# Patient Record
Sex: Female | Born: 1940
Health system: Southern US, Community
[De-identification: ages and names within clinical notes are randomized; demographics above are authoritative.]

## PROBLEM LIST (undated history)

## (undated) DIAGNOSIS — I480 Paroxysmal atrial fibrillation: Secondary | ICD-10-CM

## (undated) DIAGNOSIS — F0390 Unspecified dementia without behavioral disturbance: Secondary | ICD-10-CM

## (undated) DIAGNOSIS — I10 Essential (primary) hypertension: Secondary | ICD-10-CM

## (undated) DIAGNOSIS — M1990 Unspecified osteoarthritis, unspecified site: Secondary | ICD-10-CM

## (undated) HISTORY — PX: ABDOMINAL HYSTERECTOMY: SHX81

---

## 2005-06-14 ENCOUNTER — Ambulatory Visit: Payer: Self-pay | Admitting: Internal Medicine

## 2005-06-22 ENCOUNTER — Ambulatory Visit: Payer: Self-pay | Admitting: Internal Medicine

## 2006-05-22 ENCOUNTER — Ambulatory Visit: Payer: Self-pay | Admitting: Internal Medicine

## 2006-06-04 ENCOUNTER — Ambulatory Visit: Payer: Self-pay | Admitting: Internal Medicine

## 2006-10-01 ENCOUNTER — Ambulatory Visit: Payer: Self-pay | Admitting: Gastroenterology

## 2006-10-15 ENCOUNTER — Ambulatory Visit: Payer: Self-pay | Admitting: Gastroenterology

## 2007-06-06 ENCOUNTER — Ambulatory Visit: Payer: Self-pay | Admitting: Internal Medicine

## 2008-03-14 ENCOUNTER — Emergency Department: Payer: Self-pay | Admitting: Emergency Medicine

## 2008-06-08 ENCOUNTER — Ambulatory Visit: Payer: Self-pay | Admitting: Internal Medicine

## 2008-12-11 ENCOUNTER — Observation Stay: Payer: Self-pay | Admitting: Internal Medicine

## 2009-07-02 ENCOUNTER — Ambulatory Visit: Payer: Self-pay | Admitting: Internal Medicine

## 2010-07-04 ENCOUNTER — Ambulatory Visit: Payer: Self-pay | Admitting: Internal Medicine

## 2011-07-06 ENCOUNTER — Ambulatory Visit: Payer: Self-pay | Admitting: Internal Medicine

## 2012-05-03 ENCOUNTER — Ambulatory Visit: Payer: Self-pay | Admitting: Gastroenterology

## 2012-07-09 ENCOUNTER — Ambulatory Visit: Payer: Self-pay | Admitting: Internal Medicine

## 2012-12-12 ENCOUNTER — Other Ambulatory Visit: Payer: Self-pay | Admitting: Gastroenterology

## 2012-12-12 DIAGNOSIS — Z1211 Encounter for screening for malignant neoplasm of colon: Secondary | ICD-10-CM

## 2012-12-12 DIAGNOSIS — K5730 Diverticulosis of large intestine without perforation or abscess without bleeding: Secondary | ICD-10-CM

## 2012-12-23 ENCOUNTER — Ambulatory Visit
Admission: RE | Admit: 2012-12-23 | Discharge: 2012-12-23 | Disposition: A | Discharge status: Home / Self Care | Payer: Medicare Other | Source: Ambulatory Visit | Attending: Gastroenterology | Admitting: Gastroenterology

## 2012-12-23 DIAGNOSIS — K5730 Diverticulosis of large intestine without perforation or abscess without bleeding: Secondary | ICD-10-CM

## 2012-12-23 DIAGNOSIS — Z1211 Encounter for screening for malignant neoplasm of colon: Secondary | ICD-10-CM

## 2013-07-10 ENCOUNTER — Ambulatory Visit: Payer: Self-pay | Admitting: Family Medicine

## 2014-02-13 DIAGNOSIS — E7800 Pure hypercholesterolemia, unspecified: Secondary | ICD-10-CM | POA: Insufficient documentation

## 2014-02-13 DIAGNOSIS — I10 Essential (primary) hypertension: Secondary | ICD-10-CM | POA: Diagnosis present

## 2014-07-14 ENCOUNTER — Ambulatory Visit: Payer: Self-pay | Admitting: Family Medicine

## 2014-07-23 ENCOUNTER — Emergency Department: Payer: Self-pay | Admitting: Internal Medicine

## 2014-07-23 LAB — URINALYSIS, COMPLETE
BACTERIA: NONE SEEN
BILIRUBIN, UR: NEGATIVE
BLOOD: NEGATIVE
GLUCOSE, UR: NEGATIVE mg/dL (ref 0–75)
Hyaline Cast: 2
KETONE: NEGATIVE
Nitrite: NEGATIVE
PH: 6 (ref 4.5–8.0)
PROTEIN: NEGATIVE
RBC,UR: 1 /HPF (ref 0–5)
SPECIFIC GRAVITY: 1.023 (ref 1.003–1.030)
Squamous Epithelial: 1
WBC UR: <1 /HPF (ref 0–5)

## 2014-07-23 LAB — TROPONIN I

## 2014-07-23 LAB — COMPREHENSIVE METABOLIC PANEL
ALT: 25 U/L
AST: 15 U/L (ref 15–37)
Albumin: 3.6 g/dL (ref 3.4–5.0)
Alkaline Phosphatase: 40 U/L — ABNORMAL LOW
Anion Gap: 6 — ABNORMAL LOW (ref 7–16)
BILIRUBIN TOTAL: 0.3 mg/dL (ref 0.2–1.0)
BUN: 18 mg/dL (ref 7–18)
CO2: 30 mmol/L (ref 21–32)
CREATININE: 0.77 mg/dL (ref 0.60–1.30)
Calcium, Total: 8.9 mg/dL (ref 8.5–10.1)
Chloride: 105 mmol/L (ref 98–107)
Glucose: 83 mg/dL (ref 65–99)
OSMOLALITY: 282 (ref 275–301)
Potassium: 3.7 mmol/L (ref 3.5–5.1)
SODIUM: 141 mmol/L (ref 136–145)
Total Protein: 7.3 g/dL (ref 6.4–8.2)

## 2014-07-23 LAB — CBC
HCT: 34.1 % — ABNORMAL LOW (ref 35.0–47.0)
HGB: 11 g/dL — ABNORMAL LOW (ref 12.0–16.0)
MCH: 26.3 pg (ref 26.0–34.0)
MCHC: 32.3 g/dL (ref 32.0–36.0)
MCV: 81 fL (ref 80–100)
Platelet: 313 10*3/uL (ref 150–440)
RBC: 4.2 10*6/uL (ref 3.80–5.20)
RDW: 14.2 % (ref 11.5–14.5)
WBC: 5.5 10*3/uL (ref 3.6–11.0)

## 2014-11-18 DIAGNOSIS — D649 Anemia, unspecified: Secondary | ICD-10-CM | POA: Diagnosis not present

## 2014-11-18 DIAGNOSIS — E785 Hyperlipidemia, unspecified: Secondary | ICD-10-CM | POA: Diagnosis not present

## 2014-11-18 DIAGNOSIS — Z79899 Other long term (current) drug therapy: Secondary | ICD-10-CM | POA: Diagnosis not present

## 2014-11-19 DIAGNOSIS — M25561 Pain in right knee: Secondary | ICD-10-CM | POA: Diagnosis not present

## 2014-11-19 DIAGNOSIS — G8929 Other chronic pain: Secondary | ICD-10-CM | POA: Diagnosis not present

## 2014-11-26 DIAGNOSIS — D508 Other iron deficiency anemias: Secondary | ICD-10-CM | POA: Diagnosis not present

## 2014-11-26 DIAGNOSIS — I10 Essential (primary) hypertension: Secondary | ICD-10-CM | POA: Diagnosis not present

## 2014-11-26 DIAGNOSIS — E780 Pure hypercholesterolemia: Secondary | ICD-10-CM | POA: Diagnosis not present

## 2014-11-26 DIAGNOSIS — M1711 Unilateral primary osteoarthritis, right knee: Secondary | ICD-10-CM | POA: Diagnosis not present

## 2014-12-29 DIAGNOSIS — M1711 Unilateral primary osteoarthritis, right knee: Secondary | ICD-10-CM | POA: Diagnosis not present

## 2014-12-31 ENCOUNTER — Other Ambulatory Visit: Payer: Self-pay | Admitting: Unknown Physician Specialty

## 2014-12-31 DIAGNOSIS — M1711 Unilateral primary osteoarthritis, right knee: Secondary | ICD-10-CM

## 2014-12-31 DIAGNOSIS — M25561 Pain in right knee: Secondary | ICD-10-CM

## 2015-01-13 ENCOUNTER — Ambulatory Visit
Admission: RE | Admit: 2015-01-13 | Discharge: 2015-01-13 | Disposition: A | Discharge status: Home / Self Care | Payer: Commercial Managed Care - HMO | Source: Ambulatory Visit | Attending: Unknown Physician Specialty | Admitting: Unknown Physician Specialty

## 2015-01-13 DIAGNOSIS — S83241A Other tear of medial meniscus, current injury, right knee, initial encounter: Secondary | ICD-10-CM | POA: Insufficient documentation

## 2015-01-13 DIAGNOSIS — M25461 Effusion, right knee: Secondary | ICD-10-CM | POA: Diagnosis not present

## 2015-01-13 DIAGNOSIS — M7121 Synovial cyst of popliteal space [Baker], right knee: Secondary | ICD-10-CM | POA: Diagnosis not present

## 2015-01-13 DIAGNOSIS — M25561 Pain in right knee: Secondary | ICD-10-CM | POA: Diagnosis present

## 2015-01-13 DIAGNOSIS — X58XXXA Exposure to other specified factors, initial encounter: Secondary | ICD-10-CM | POA: Insufficient documentation

## 2015-01-13 DIAGNOSIS — M23231 Derangement of other medial meniscus due to old tear or injury, right knee: Secondary | ICD-10-CM | POA: Diagnosis not present

## 2015-04-02 DIAGNOSIS — R634 Abnormal weight loss: Secondary | ICD-10-CM | POA: Diagnosis not present

## 2015-04-02 DIAGNOSIS — Z79899 Other long term (current) drug therapy: Secondary | ICD-10-CM | POA: Diagnosis not present

## 2015-04-02 DIAGNOSIS — E780 Pure hypercholesterolemia: Secondary | ICD-10-CM | POA: Diagnosis not present

## 2015-04-02 DIAGNOSIS — R0602 Shortness of breath: Secondary | ICD-10-CM | POA: Diagnosis not present

## 2015-04-02 DIAGNOSIS — D508 Other iron deficiency anemias: Secondary | ICD-10-CM | POA: Diagnosis not present

## 2015-04-02 DIAGNOSIS — R61 Generalized hyperhidrosis: Secondary | ICD-10-CM | POA: Diagnosis not present

## 2015-04-05 DIAGNOSIS — R0602 Shortness of breath: Secondary | ICD-10-CM | POA: Diagnosis not present

## 2015-05-24 DIAGNOSIS — E780 Pure hypercholesterolemia: Secondary | ICD-10-CM | POA: Diagnosis not present

## 2015-05-24 DIAGNOSIS — M1711 Unilateral primary osteoarthritis, right knee: Secondary | ICD-10-CM | POA: Diagnosis not present

## 2015-05-24 DIAGNOSIS — Z79899 Other long term (current) drug therapy: Secondary | ICD-10-CM | POA: Diagnosis not present

## 2015-05-24 DIAGNOSIS — I10 Essential (primary) hypertension: Secondary | ICD-10-CM | POA: Diagnosis not present

## 2015-09-10 DIAGNOSIS — H2513 Age-related nuclear cataract, bilateral: Secondary | ICD-10-CM | POA: Diagnosis not present

## 2015-11-15 DIAGNOSIS — D649 Anemia, unspecified: Secondary | ICD-10-CM | POA: Diagnosis not present

## 2015-11-15 DIAGNOSIS — Z79899 Other long term (current) drug therapy: Secondary | ICD-10-CM | POA: Diagnosis not present

## 2015-11-15 DIAGNOSIS — E7800 Pure hypercholesterolemia, unspecified: Secondary | ICD-10-CM | POA: Diagnosis not present

## 2015-11-23 ENCOUNTER — Other Ambulatory Visit: Payer: Self-pay | Admitting: Family Medicine

## 2015-11-23 DIAGNOSIS — Z0000 Encounter for general adult medical examination without abnormal findings: Secondary | ICD-10-CM | POA: Diagnosis not present

## 2015-11-23 DIAGNOSIS — Z1231 Encounter for screening mammogram for malignant neoplasm of breast: Secondary | ICD-10-CM

## 2015-11-23 DIAGNOSIS — Z79899 Other long term (current) drug therapy: Secondary | ICD-10-CM | POA: Diagnosis not present

## 2015-11-23 DIAGNOSIS — E7800 Pure hypercholesterolemia, unspecified: Secondary | ICD-10-CM | POA: Diagnosis not present

## 2015-11-23 DIAGNOSIS — I10 Essential (primary) hypertension: Secondary | ICD-10-CM | POA: Diagnosis not present

## 2015-12-09 ENCOUNTER — Other Ambulatory Visit: Payer: Self-pay | Admitting: Family Medicine

## 2015-12-09 ENCOUNTER — Ambulatory Visit
Admission: RE | Admit: 2015-12-09 | Discharge: 2015-12-09 | Disposition: A | Discharge status: Home / Self Care | Payer: Commercial Managed Care - HMO | Source: Ambulatory Visit | Attending: Family Medicine | Admitting: Family Medicine

## 2015-12-09 DIAGNOSIS — Z1231 Encounter for screening mammogram for malignant neoplasm of breast: Secondary | ICD-10-CM

## 2015-12-30 DIAGNOSIS — R51 Headache: Secondary | ICD-10-CM | POA: Diagnosis not present

## 2016-02-01 DIAGNOSIS — L720 Epidermal cyst: Secondary | ICD-10-CM | POA: Diagnosis not present

## 2016-05-18 DIAGNOSIS — E7800 Pure hypercholesterolemia, unspecified: Secondary | ICD-10-CM | POA: Diagnosis not present

## 2016-05-18 DIAGNOSIS — Z79899 Other long term (current) drug therapy: Secondary | ICD-10-CM | POA: Diagnosis not present

## 2016-05-25 DIAGNOSIS — R634 Abnormal weight loss: Secondary | ICD-10-CM | POA: Diagnosis not present

## 2016-05-25 DIAGNOSIS — E7800 Pure hypercholesterolemia, unspecified: Secondary | ICD-10-CM | POA: Diagnosis not present

## 2016-05-25 DIAGNOSIS — Z79899 Other long term (current) drug therapy: Secondary | ICD-10-CM | POA: Diagnosis not present

## 2016-05-25 DIAGNOSIS — I10 Essential (primary) hypertension: Secondary | ICD-10-CM | POA: Diagnosis not present

## 2016-05-25 DIAGNOSIS — Z23 Encounter for immunization: Secondary | ICD-10-CM | POA: Diagnosis not present

## 2016-05-29 DIAGNOSIS — M67442 Ganglion, left hand: Secondary | ICD-10-CM | POA: Diagnosis not present

## 2016-05-29 DIAGNOSIS — M79643 Pain in unspecified hand: Secondary | ICD-10-CM | POA: Insufficient documentation

## 2016-10-30 ENCOUNTER — Other Ambulatory Visit: Payer: Self-pay | Admitting: Family Medicine

## 2016-10-30 DIAGNOSIS — Z1231 Encounter for screening mammogram for malignant neoplasm of breast: Secondary | ICD-10-CM

## 2016-11-16 DIAGNOSIS — Z79899 Other long term (current) drug therapy: Secondary | ICD-10-CM | POA: Diagnosis not present

## 2016-11-16 DIAGNOSIS — E7800 Pure hypercholesterolemia, unspecified: Secondary | ICD-10-CM | POA: Diagnosis not present

## 2016-11-27 DIAGNOSIS — Z0000 Encounter for general adult medical examination without abnormal findings: Secondary | ICD-10-CM | POA: Diagnosis not present

## 2016-12-12 ENCOUNTER — Ambulatory Visit
Admission: RE | Admit: 2016-12-12 | Discharge: 2016-12-12 | Disposition: A | Discharge status: Home / Self Care | Payer: Medicare HMO | Source: Ambulatory Visit | Attending: Family Medicine | Admitting: Family Medicine

## 2016-12-12 DIAGNOSIS — Z1231 Encounter for screening mammogram for malignant neoplasm of breast: Secondary | ICD-10-CM | POA: Diagnosis not present

## 2016-12-18 DIAGNOSIS — H2513 Age-related nuclear cataract, bilateral: Secondary | ICD-10-CM | POA: Diagnosis not present

## 2017-01-19 DIAGNOSIS — H8309 Labyrinthitis, unspecified ear: Secondary | ICD-10-CM | POA: Diagnosis not present

## 2017-05-11 DIAGNOSIS — E876 Hypokalemia: Secondary | ICD-10-CM | POA: Diagnosis not present

## 2017-05-11 DIAGNOSIS — R634 Abnormal weight loss: Secondary | ICD-10-CM | POA: Diagnosis not present

## 2017-05-11 DIAGNOSIS — R5383 Other fatigue: Secondary | ICD-10-CM | POA: Diagnosis not present

## 2017-05-11 DIAGNOSIS — R630 Anorexia: Secondary | ICD-10-CM | POA: Diagnosis not present

## 2017-05-11 DIAGNOSIS — R42 Dizziness and giddiness: Secondary | ICD-10-CM | POA: Diagnosis not present

## 2017-05-11 DIAGNOSIS — R5381 Other malaise: Secondary | ICD-10-CM | POA: Diagnosis not present

## 2017-05-23 DIAGNOSIS — E7800 Pure hypercholesterolemia, unspecified: Secondary | ICD-10-CM | POA: Diagnosis not present

## 2017-05-23 DIAGNOSIS — Z79899 Other long term (current) drug therapy: Secondary | ICD-10-CM | POA: Diagnosis not present

## 2017-05-31 DIAGNOSIS — E7800 Pure hypercholesterolemia, unspecified: Secondary | ICD-10-CM | POA: Diagnosis not present

## 2017-05-31 DIAGNOSIS — E876 Hypokalemia: Secondary | ICD-10-CM | POA: Diagnosis not present

## 2017-05-31 DIAGNOSIS — Z79899 Other long term (current) drug therapy: Secondary | ICD-10-CM | POA: Diagnosis not present

## 2017-05-31 DIAGNOSIS — M25561 Pain in right knee: Secondary | ICD-10-CM | POA: Diagnosis not present

## 2017-05-31 DIAGNOSIS — I10 Essential (primary) hypertension: Secondary | ICD-10-CM | POA: Diagnosis not present

## 2017-05-31 DIAGNOSIS — G8929 Other chronic pain: Secondary | ICD-10-CM | POA: Diagnosis not present

## 2017-12-05 DIAGNOSIS — E7800 Pure hypercholesterolemia, unspecified: Secondary | ICD-10-CM | POA: Diagnosis not present

## 2017-12-05 DIAGNOSIS — Z79899 Other long term (current) drug therapy: Secondary | ICD-10-CM | POA: Diagnosis not present

## 2017-12-13 DIAGNOSIS — Z0000 Encounter for general adult medical examination without abnormal findings: Secondary | ICD-10-CM | POA: Diagnosis not present

## 2017-12-17 ENCOUNTER — Other Ambulatory Visit: Payer: Self-pay | Admitting: Family Medicine

## 2017-12-17 DIAGNOSIS — Z1231 Encounter for screening mammogram for malignant neoplasm of breast: Secondary | ICD-10-CM

## 2017-12-31 ENCOUNTER — Ambulatory Visit
Admission: RE | Admit: 2017-12-31 | Discharge: 2017-12-31 | Disposition: A | Discharge status: Home / Self Care | Payer: Medicare HMO | Source: Ambulatory Visit | Attending: Family Medicine | Admitting: Family Medicine

## 2017-12-31 DIAGNOSIS — Z1231 Encounter for screening mammogram for malignant neoplasm of breast: Secondary | ICD-10-CM | POA: Diagnosis not present

## 2018-05-08 DIAGNOSIS — H2513 Age-related nuclear cataract, bilateral: Secondary | ICD-10-CM | POA: Diagnosis not present

## 2018-06-11 DIAGNOSIS — Z79899 Other long term (current) drug therapy: Secondary | ICD-10-CM | POA: Diagnosis not present

## 2018-06-11 DIAGNOSIS — E7800 Pure hypercholesterolemia, unspecified: Secondary | ICD-10-CM | POA: Diagnosis not present

## 2018-06-14 DIAGNOSIS — E7800 Pure hypercholesterolemia, unspecified: Secondary | ICD-10-CM | POA: Diagnosis not present

## 2018-06-14 DIAGNOSIS — I10 Essential (primary) hypertension: Secondary | ICD-10-CM | POA: Diagnosis not present

## 2018-06-14 DIAGNOSIS — R634 Abnormal weight loss: Secondary | ICD-10-CM | POA: Diagnosis not present

## 2018-06-14 DIAGNOSIS — Z23 Encounter for immunization: Secondary | ICD-10-CM | POA: Diagnosis not present

## 2018-06-14 DIAGNOSIS — Z79899 Other long term (current) drug therapy: Secondary | ICD-10-CM | POA: Diagnosis not present

## 2018-08-19 DIAGNOSIS — R531 Weakness: Secondary | ICD-10-CM | POA: Diagnosis not present

## 2018-08-19 DIAGNOSIS — R630 Anorexia: Secondary | ICD-10-CM | POA: Diagnosis not present

## 2018-08-19 DIAGNOSIS — R5381 Other malaise: Secondary | ICD-10-CM | POA: Diagnosis not present

## 2018-08-19 DIAGNOSIS — R634 Abnormal weight loss: Secondary | ICD-10-CM | POA: Diagnosis not present

## 2018-08-19 DIAGNOSIS — R5383 Other fatigue: Secondary | ICD-10-CM | POA: Diagnosis not present

## 2018-12-19 DIAGNOSIS — D649 Anemia, unspecified: Secondary | ICD-10-CM | POA: Diagnosis present

## 2019-10-07 ENCOUNTER — Other Ambulatory Visit: Payer: Self-pay | Admitting: Internal Medicine

## 2019-10-07 DIAGNOSIS — R410 Disorientation, unspecified: Secondary | ICD-10-CM

## 2019-10-07 DIAGNOSIS — R413 Other amnesia: Secondary | ICD-10-CM

## 2019-10-20 ENCOUNTER — Ambulatory Visit: Payer: Medicare HMO

## 2019-10-24 ENCOUNTER — Ambulatory Visit
Admission: RE | Admit: 2019-10-24 | Discharge: 2019-10-24 | Disposition: A | Discharge status: Home / Self Care | Payer: Medicare HMO | Source: Ambulatory Visit | Attending: Internal Medicine | Admitting: Internal Medicine

## 2019-10-24 ENCOUNTER — Other Ambulatory Visit: Payer: Self-pay

## 2019-10-24 DIAGNOSIS — R413 Other amnesia: Secondary | ICD-10-CM

## 2019-10-24 DIAGNOSIS — R410 Disorientation, unspecified: Secondary | ICD-10-CM | POA: Diagnosis present

## 2019-12-03 DIAGNOSIS — F0150 Vascular dementia without behavioral disturbance: Secondary | ICD-10-CM | POA: Diagnosis present

## 2019-12-03 DIAGNOSIS — F0280 Dementia in other diseases classified elsewhere without behavioral disturbance: Secondary | ICD-10-CM | POA: Diagnosis present

## 2019-12-03 DIAGNOSIS — I6381 Other cerebral infarction due to occlusion or stenosis of small artery: Secondary | ICD-10-CM | POA: Insufficient documentation

## 2020-02-27 DIAGNOSIS — Z79899 Other long term (current) drug therapy: Secondary | ICD-10-CM | POA: Diagnosis not present

## 2020-02-27 DIAGNOSIS — E7800 Pure hypercholesterolemia, unspecified: Secondary | ICD-10-CM | POA: Diagnosis not present

## 2020-03-05 DIAGNOSIS — M1711 Unilateral primary osteoarthritis, right knee: Secondary | ICD-10-CM | POA: Diagnosis not present

## 2020-03-05 DIAGNOSIS — M25561 Pain in right knee: Secondary | ICD-10-CM | POA: Diagnosis not present

## 2020-03-05 DIAGNOSIS — G8929 Other chronic pain: Secondary | ICD-10-CM | POA: Diagnosis not present

## 2020-03-05 DIAGNOSIS — I10 Essential (primary) hypertension: Secondary | ICD-10-CM | POA: Diagnosis not present

## 2020-03-05 DIAGNOSIS — R296 Repeated falls: Secondary | ICD-10-CM | POA: Diagnosis not present

## 2020-03-18 DIAGNOSIS — F0150 Vascular dementia without behavioral disturbance: Secondary | ICD-10-CM | POA: Diagnosis not present

## 2020-03-18 DIAGNOSIS — E7800 Pure hypercholesterolemia, unspecified: Secondary | ICD-10-CM | POA: Diagnosis not present

## 2020-03-18 DIAGNOSIS — M1711 Unilateral primary osteoarthritis, right knee: Secondary | ICD-10-CM | POA: Diagnosis not present

## 2020-03-18 DIAGNOSIS — G43909 Migraine, unspecified, not intractable, without status migrainosus: Secondary | ICD-10-CM | POA: Diagnosis not present

## 2020-03-18 DIAGNOSIS — G309 Alzheimer's disease, unspecified: Secondary | ICD-10-CM | POA: Diagnosis not present

## 2020-03-18 DIAGNOSIS — F0280 Dementia in other diseases classified elsewhere without behavioral disturbance: Secondary | ICD-10-CM | POA: Diagnosis not present

## 2020-03-18 DIAGNOSIS — Z7982 Long term (current) use of aspirin: Secondary | ICD-10-CM | POA: Diagnosis not present

## 2020-03-18 DIAGNOSIS — G8929 Other chronic pain: Secondary | ICD-10-CM | POA: Diagnosis not present

## 2020-03-18 DIAGNOSIS — D649 Anemia, unspecified: Secondary | ICD-10-CM | POA: Diagnosis not present

## 2020-03-22 DIAGNOSIS — D649 Anemia, unspecified: Secondary | ICD-10-CM | POA: Diagnosis not present

## 2020-03-22 DIAGNOSIS — F0150 Vascular dementia without behavioral disturbance: Secondary | ICD-10-CM | POA: Diagnosis not present

## 2020-03-22 DIAGNOSIS — R0602 Shortness of breath: Secondary | ICD-10-CM | POA: Diagnosis not present

## 2020-03-22 DIAGNOSIS — Z79899 Other long term (current) drug therapy: Secondary | ICD-10-CM | POA: Diagnosis not present

## 2020-03-22 DIAGNOSIS — I499 Cardiac arrhythmia, unspecified: Secondary | ICD-10-CM | POA: Diagnosis not present

## 2020-03-22 DIAGNOSIS — E785 Hyperlipidemia, unspecified: Secondary | ICD-10-CM | POA: Diagnosis not present

## 2020-03-22 DIAGNOSIS — I10 Essential (primary) hypertension: Secondary | ICD-10-CM | POA: Diagnosis not present

## 2020-03-22 DIAGNOSIS — F0390 Unspecified dementia without behavioral disturbance: Secondary | ICD-10-CM | POA: Diagnosis not present

## 2020-03-22 DIAGNOSIS — Z1211 Encounter for screening for malignant neoplasm of colon: Secondary | ICD-10-CM | POA: Diagnosis not present

## 2020-03-22 DIAGNOSIS — Z0000 Encounter for general adult medical examination without abnormal findings: Secondary | ICD-10-CM | POA: Diagnosis not present

## 2020-03-22 DIAGNOSIS — F0280 Dementia in other diseases classified elsewhere without behavioral disturbance: Secondary | ICD-10-CM | POA: Diagnosis not present

## 2020-03-22 DIAGNOSIS — G309 Alzheimer's disease, unspecified: Secondary | ICD-10-CM | POA: Diagnosis not present

## 2020-03-22 DIAGNOSIS — I6381 Other cerebral infarction due to occlusion or stenosis of small artery: Secondary | ICD-10-CM | POA: Diagnosis not present

## 2020-03-26 DIAGNOSIS — I4891 Unspecified atrial fibrillation: Secondary | ICD-10-CM | POA: Diagnosis not present

## 2020-03-26 DIAGNOSIS — F0280 Dementia in other diseases classified elsewhere without behavioral disturbance: Secondary | ICD-10-CM | POA: Diagnosis not present

## 2020-03-26 DIAGNOSIS — G43909 Migraine, unspecified, not intractable, without status migrainosus: Secondary | ICD-10-CM | POA: Diagnosis not present

## 2020-03-26 DIAGNOSIS — M1711 Unilateral primary osteoarthritis, right knee: Secondary | ICD-10-CM | POA: Diagnosis not present

## 2020-03-26 DIAGNOSIS — E7800 Pure hypercholesterolemia, unspecified: Secondary | ICD-10-CM | POA: Diagnosis not present

## 2020-03-26 DIAGNOSIS — F0150 Vascular dementia without behavioral disturbance: Secondary | ICD-10-CM | POA: Diagnosis not present

## 2020-03-26 DIAGNOSIS — I10 Essential (primary) hypertension: Secondary | ICD-10-CM | POA: Diagnosis not present

## 2020-03-26 DIAGNOSIS — G309 Alzheimer's disease, unspecified: Secondary | ICD-10-CM | POA: Diagnosis not present

## 2020-03-26 DIAGNOSIS — D649 Anemia, unspecified: Secondary | ICD-10-CM | POA: Diagnosis not present

## 2020-03-29 DIAGNOSIS — F0150 Vascular dementia without behavioral disturbance: Secondary | ICD-10-CM | POA: Diagnosis not present

## 2020-03-29 DIAGNOSIS — I10 Essential (primary) hypertension: Secondary | ICD-10-CM | POA: Diagnosis not present

## 2020-03-29 DIAGNOSIS — E7800 Pure hypercholesterolemia, unspecified: Secondary | ICD-10-CM | POA: Diagnosis not present

## 2020-03-29 DIAGNOSIS — F0280 Dementia in other diseases classified elsewhere without behavioral disturbance: Secondary | ICD-10-CM | POA: Diagnosis not present

## 2020-03-29 DIAGNOSIS — G309 Alzheimer's disease, unspecified: Secondary | ICD-10-CM | POA: Diagnosis not present

## 2020-03-29 DIAGNOSIS — G43909 Migraine, unspecified, not intractable, without status migrainosus: Secondary | ICD-10-CM | POA: Diagnosis not present

## 2020-03-29 DIAGNOSIS — I4891 Unspecified atrial fibrillation: Secondary | ICD-10-CM | POA: Diagnosis not present

## 2020-03-29 DIAGNOSIS — M1711 Unilateral primary osteoarthritis, right knee: Secondary | ICD-10-CM | POA: Diagnosis not present

## 2020-03-29 DIAGNOSIS — D649 Anemia, unspecified: Secondary | ICD-10-CM | POA: Diagnosis not present

## 2020-03-31 DIAGNOSIS — I10 Essential (primary) hypertension: Secondary | ICD-10-CM | POA: Diagnosis not present

## 2020-03-31 DIAGNOSIS — M1711 Unilateral primary osteoarthritis, right knee: Secondary | ICD-10-CM | POA: Diagnosis not present

## 2020-03-31 DIAGNOSIS — F0150 Vascular dementia without behavioral disturbance: Secondary | ICD-10-CM | POA: Diagnosis not present

## 2020-03-31 DIAGNOSIS — F0280 Dementia in other diseases classified elsewhere without behavioral disturbance: Secondary | ICD-10-CM | POA: Diagnosis not present

## 2020-03-31 DIAGNOSIS — G43909 Migraine, unspecified, not intractable, without status migrainosus: Secondary | ICD-10-CM | POA: Diagnosis not present

## 2020-03-31 DIAGNOSIS — G309 Alzheimer's disease, unspecified: Secondary | ICD-10-CM | POA: Diagnosis not present

## 2020-03-31 DIAGNOSIS — I4891 Unspecified atrial fibrillation: Secondary | ICD-10-CM | POA: Diagnosis not present

## 2020-04-05 DIAGNOSIS — I4891 Unspecified atrial fibrillation: Secondary | ICD-10-CM | POA: Diagnosis not present

## 2020-04-05 DIAGNOSIS — G309 Alzheimer's disease, unspecified: Secondary | ICD-10-CM | POA: Diagnosis not present

## 2020-04-05 DIAGNOSIS — M1711 Unilateral primary osteoarthritis, right knee: Secondary | ICD-10-CM | POA: Diagnosis not present

## 2020-04-05 DIAGNOSIS — I10 Essential (primary) hypertension: Secondary | ICD-10-CM | POA: Diagnosis not present

## 2020-04-05 DIAGNOSIS — E7800 Pure hypercholesterolemia, unspecified: Secondary | ICD-10-CM | POA: Diagnosis not present

## 2020-04-05 DIAGNOSIS — F0150 Vascular dementia without behavioral disturbance: Secondary | ICD-10-CM | POA: Diagnosis not present

## 2020-04-05 DIAGNOSIS — F0280 Dementia in other diseases classified elsewhere without behavioral disturbance: Secondary | ICD-10-CM | POA: Diagnosis not present

## 2020-04-05 DIAGNOSIS — D649 Anemia, unspecified: Secondary | ICD-10-CM | POA: Diagnosis not present

## 2020-04-05 DIAGNOSIS — I6381 Other cerebral infarction due to occlusion or stenosis of small artery: Secondary | ICD-10-CM | POA: Diagnosis not present

## 2020-04-12 DIAGNOSIS — F0280 Dementia in other diseases classified elsewhere without behavioral disturbance: Secondary | ICD-10-CM | POA: Diagnosis not present

## 2020-04-12 DIAGNOSIS — G309 Alzheimer's disease, unspecified: Secondary | ICD-10-CM | POA: Diagnosis not present

## 2020-04-12 DIAGNOSIS — I4891 Unspecified atrial fibrillation: Secondary | ICD-10-CM | POA: Diagnosis not present

## 2020-04-12 DIAGNOSIS — G43909 Migraine, unspecified, not intractable, without status migrainosus: Secondary | ICD-10-CM | POA: Diagnosis not present

## 2020-04-12 DIAGNOSIS — E7800 Pure hypercholesterolemia, unspecified: Secondary | ICD-10-CM | POA: Diagnosis not present

## 2020-04-12 DIAGNOSIS — I10 Essential (primary) hypertension: Secondary | ICD-10-CM | POA: Diagnosis not present

## 2020-04-12 DIAGNOSIS — M1711 Unilateral primary osteoarthritis, right knee: Secondary | ICD-10-CM | POA: Diagnosis not present

## 2020-04-12 DIAGNOSIS — F0150 Vascular dementia without behavioral disturbance: Secondary | ICD-10-CM | POA: Diagnosis not present

## 2020-04-12 DIAGNOSIS — D649 Anemia, unspecified: Secondary | ICD-10-CM | POA: Diagnosis not present

## 2020-04-15 DIAGNOSIS — E7800 Pure hypercholesterolemia, unspecified: Secondary | ICD-10-CM | POA: Diagnosis not present

## 2020-04-15 DIAGNOSIS — G43909 Migraine, unspecified, not intractable, without status migrainosus: Secondary | ICD-10-CM | POA: Diagnosis not present

## 2020-04-15 DIAGNOSIS — F0150 Vascular dementia without behavioral disturbance: Secondary | ICD-10-CM | POA: Diagnosis not present

## 2020-04-15 DIAGNOSIS — M1711 Unilateral primary osteoarthritis, right knee: Secondary | ICD-10-CM | POA: Diagnosis not present

## 2020-04-15 DIAGNOSIS — F0280 Dementia in other diseases classified elsewhere without behavioral disturbance: Secondary | ICD-10-CM | POA: Diagnosis not present

## 2020-04-15 DIAGNOSIS — D649 Anemia, unspecified: Secondary | ICD-10-CM | POA: Diagnosis not present

## 2020-04-15 DIAGNOSIS — I4891 Unspecified atrial fibrillation: Secondary | ICD-10-CM | POA: Diagnosis not present

## 2020-04-15 DIAGNOSIS — G309 Alzheimer's disease, unspecified: Secondary | ICD-10-CM | POA: Diagnosis not present

## 2020-04-15 DIAGNOSIS — I10 Essential (primary) hypertension: Secondary | ICD-10-CM | POA: Diagnosis not present

## 2020-04-23 DIAGNOSIS — D649 Anemia, unspecified: Secondary | ICD-10-CM | POA: Diagnosis not present

## 2020-04-23 DIAGNOSIS — M1711 Unilateral primary osteoarthritis, right knee: Secondary | ICD-10-CM | POA: Diagnosis not present

## 2020-04-23 DIAGNOSIS — I4891 Unspecified atrial fibrillation: Secondary | ICD-10-CM | POA: Diagnosis not present

## 2020-04-23 DIAGNOSIS — F0150 Vascular dementia without behavioral disturbance: Secondary | ICD-10-CM | POA: Diagnosis not present

## 2020-04-23 DIAGNOSIS — F0280 Dementia in other diseases classified elsewhere without behavioral disturbance: Secondary | ICD-10-CM | POA: Diagnosis not present

## 2020-04-23 DIAGNOSIS — G309 Alzheimer's disease, unspecified: Secondary | ICD-10-CM | POA: Diagnosis not present

## 2020-04-23 DIAGNOSIS — E7800 Pure hypercholesterolemia, unspecified: Secondary | ICD-10-CM | POA: Diagnosis not present

## 2020-04-23 DIAGNOSIS — I10 Essential (primary) hypertension: Secondary | ICD-10-CM | POA: Diagnosis not present

## 2020-04-23 DIAGNOSIS — G43909 Migraine, unspecified, not intractable, without status migrainosus: Secondary | ICD-10-CM | POA: Diagnosis not present

## 2020-05-07 DIAGNOSIS — I10 Essential (primary) hypertension: Secondary | ICD-10-CM | POA: Diagnosis not present

## 2020-05-07 DIAGNOSIS — G43909 Migraine, unspecified, not intractable, without status migrainosus: Secondary | ICD-10-CM | POA: Diagnosis not present

## 2020-05-07 DIAGNOSIS — G309 Alzheimer's disease, unspecified: Secondary | ICD-10-CM | POA: Diagnosis not present

## 2020-05-07 DIAGNOSIS — F0150 Vascular dementia without behavioral disturbance: Secondary | ICD-10-CM | POA: Diagnosis not present

## 2020-05-07 DIAGNOSIS — I4891 Unspecified atrial fibrillation: Secondary | ICD-10-CM | POA: Diagnosis not present

## 2020-05-07 DIAGNOSIS — E7800 Pure hypercholesterolemia, unspecified: Secondary | ICD-10-CM | POA: Diagnosis not present

## 2020-05-07 DIAGNOSIS — F0280 Dementia in other diseases classified elsewhere without behavioral disturbance: Secondary | ICD-10-CM | POA: Diagnosis not present

## 2020-05-07 DIAGNOSIS — D649 Anemia, unspecified: Secondary | ICD-10-CM | POA: Diagnosis not present

## 2020-05-07 DIAGNOSIS — M1711 Unilateral primary osteoarthritis, right knee: Secondary | ICD-10-CM | POA: Diagnosis not present

## 2020-05-31 DIAGNOSIS — I4891 Unspecified atrial fibrillation: Secondary | ICD-10-CM | POA: Diagnosis not present

## 2020-06-07 DIAGNOSIS — I480 Paroxysmal atrial fibrillation: Secondary | ICD-10-CM | POA: Diagnosis not present

## 2020-07-15 DIAGNOSIS — G8929 Other chronic pain: Secondary | ICD-10-CM | POA: Diagnosis not present

## 2020-07-15 DIAGNOSIS — Z23 Encounter for immunization: Secondary | ICD-10-CM | POA: Diagnosis not present

## 2020-07-15 DIAGNOSIS — M25562 Pain in left knee: Secondary | ICD-10-CM | POA: Diagnosis not present

## 2020-07-15 DIAGNOSIS — H61899 Other specified disorders of external ear, unspecified ear: Secondary | ICD-10-CM | POA: Diagnosis not present

## 2020-07-15 DIAGNOSIS — M25561 Pain in right knee: Secondary | ICD-10-CM | POA: Diagnosis not present

## 2020-07-20 DIAGNOSIS — G309 Alzheimer's disease, unspecified: Secondary | ICD-10-CM | POA: Diagnosis not present

## 2020-07-20 DIAGNOSIS — F0150 Vascular dementia without behavioral disturbance: Secondary | ICD-10-CM | POA: Diagnosis not present

## 2020-07-20 DIAGNOSIS — F0280 Dementia in other diseases classified elsewhere without behavioral disturbance: Secondary | ICD-10-CM | POA: Diagnosis not present

## 2020-07-28 DIAGNOSIS — M1711 Unilateral primary osteoarthritis, right knee: Secondary | ICD-10-CM | POA: Diagnosis not present

## 2020-07-28 DIAGNOSIS — F0150 Vascular dementia without behavioral disturbance: Secondary | ICD-10-CM | POA: Diagnosis not present

## 2020-07-28 DIAGNOSIS — I10 Essential (primary) hypertension: Secondary | ICD-10-CM | POA: Diagnosis not present

## 2020-07-28 DIAGNOSIS — R627 Adult failure to thrive: Secondary | ICD-10-CM | POA: Diagnosis not present

## 2020-07-28 DIAGNOSIS — F0280 Dementia in other diseases classified elsewhere without behavioral disturbance: Secondary | ICD-10-CM | POA: Diagnosis not present

## 2020-07-28 DIAGNOSIS — G43909 Migraine, unspecified, not intractable, without status migrainosus: Secondary | ICD-10-CM | POA: Diagnosis not present

## 2020-07-28 DIAGNOSIS — G8929 Other chronic pain: Secondary | ICD-10-CM | POA: Diagnosis not present

## 2020-07-28 DIAGNOSIS — G309 Alzheimer's disease, unspecified: Secondary | ICD-10-CM | POA: Diagnosis not present

## 2020-07-28 DIAGNOSIS — D649 Anemia, unspecified: Secondary | ICD-10-CM | POA: Diagnosis not present

## 2020-08-02 DIAGNOSIS — F0280 Dementia in other diseases classified elsewhere without behavioral disturbance: Secondary | ICD-10-CM | POA: Diagnosis not present

## 2020-08-02 DIAGNOSIS — G43909 Migraine, unspecified, not intractable, without status migrainosus: Secondary | ICD-10-CM | POA: Diagnosis not present

## 2020-08-02 DIAGNOSIS — F0150 Vascular dementia without behavioral disturbance: Secondary | ICD-10-CM | POA: Diagnosis not present

## 2020-08-02 DIAGNOSIS — G309 Alzheimer's disease, unspecified: Secondary | ICD-10-CM | POA: Diagnosis not present

## 2020-08-02 DIAGNOSIS — I10 Essential (primary) hypertension: Secondary | ICD-10-CM | POA: Diagnosis not present

## 2020-08-02 DIAGNOSIS — D649 Anemia, unspecified: Secondary | ICD-10-CM | POA: Diagnosis not present

## 2020-08-02 DIAGNOSIS — G8929 Other chronic pain: Secondary | ICD-10-CM | POA: Diagnosis not present

## 2020-08-02 DIAGNOSIS — M1711 Unilateral primary osteoarthritis, right knee: Secondary | ICD-10-CM | POA: Diagnosis not present

## 2020-08-02 DIAGNOSIS — R627 Adult failure to thrive: Secondary | ICD-10-CM | POA: Diagnosis not present

## 2020-08-02 DIAGNOSIS — M170 Bilateral primary osteoarthritis of knee: Secondary | ICD-10-CM | POA: Diagnosis not present

## 2020-08-04 DIAGNOSIS — G8929 Other chronic pain: Secondary | ICD-10-CM | POA: Diagnosis not present

## 2020-08-04 DIAGNOSIS — G309 Alzheimer's disease, unspecified: Secondary | ICD-10-CM | POA: Diagnosis not present

## 2020-08-04 DIAGNOSIS — M1711 Unilateral primary osteoarthritis, right knee: Secondary | ICD-10-CM | POA: Diagnosis not present

## 2020-08-04 DIAGNOSIS — I10 Essential (primary) hypertension: Secondary | ICD-10-CM | POA: Diagnosis not present

## 2020-08-04 DIAGNOSIS — D649 Anemia, unspecified: Secondary | ICD-10-CM | POA: Diagnosis not present

## 2020-08-04 DIAGNOSIS — G43909 Migraine, unspecified, not intractable, without status migrainosus: Secondary | ICD-10-CM | POA: Diagnosis not present

## 2020-08-04 DIAGNOSIS — F0280 Dementia in other diseases classified elsewhere without behavioral disturbance: Secondary | ICD-10-CM | POA: Diagnosis not present

## 2020-08-04 DIAGNOSIS — R627 Adult failure to thrive: Secondary | ICD-10-CM | POA: Diagnosis not present

## 2020-08-04 DIAGNOSIS — F0150 Vascular dementia without behavioral disturbance: Secondary | ICD-10-CM | POA: Diagnosis not present

## 2020-08-06 DIAGNOSIS — F0150 Vascular dementia without behavioral disturbance: Secondary | ICD-10-CM | POA: Diagnosis not present

## 2020-08-06 DIAGNOSIS — G8929 Other chronic pain: Secondary | ICD-10-CM | POA: Diagnosis not present

## 2020-08-06 DIAGNOSIS — G309 Alzheimer's disease, unspecified: Secondary | ICD-10-CM | POA: Diagnosis not present

## 2020-08-06 DIAGNOSIS — I10 Essential (primary) hypertension: Secondary | ICD-10-CM | POA: Diagnosis not present

## 2020-08-06 DIAGNOSIS — M1711 Unilateral primary osteoarthritis, right knee: Secondary | ICD-10-CM | POA: Diagnosis not present

## 2020-08-06 DIAGNOSIS — F0280 Dementia in other diseases classified elsewhere without behavioral disturbance: Secondary | ICD-10-CM | POA: Diagnosis not present

## 2020-08-06 DIAGNOSIS — R627 Adult failure to thrive: Secondary | ICD-10-CM | POA: Diagnosis not present

## 2020-08-06 DIAGNOSIS — G43909 Migraine, unspecified, not intractable, without status migrainosus: Secondary | ICD-10-CM | POA: Diagnosis not present

## 2020-08-06 DIAGNOSIS — D649 Anemia, unspecified: Secondary | ICD-10-CM | POA: Diagnosis not present

## 2020-08-09 DIAGNOSIS — F0280 Dementia in other diseases classified elsewhere without behavioral disturbance: Secondary | ICD-10-CM | POA: Diagnosis not present

## 2020-08-09 DIAGNOSIS — G8929 Other chronic pain: Secondary | ICD-10-CM | POA: Diagnosis not present

## 2020-08-09 DIAGNOSIS — G43909 Migraine, unspecified, not intractable, without status migrainosus: Secondary | ICD-10-CM | POA: Diagnosis not present

## 2020-08-09 DIAGNOSIS — R627 Adult failure to thrive: Secondary | ICD-10-CM | POA: Diagnosis not present

## 2020-08-09 DIAGNOSIS — G309 Alzheimer's disease, unspecified: Secondary | ICD-10-CM | POA: Diagnosis not present

## 2020-08-09 DIAGNOSIS — M1711 Unilateral primary osteoarthritis, right knee: Secondary | ICD-10-CM | POA: Diagnosis not present

## 2020-08-09 DIAGNOSIS — I10 Essential (primary) hypertension: Secondary | ICD-10-CM | POA: Diagnosis not present

## 2020-08-09 DIAGNOSIS — F0150 Vascular dementia without behavioral disturbance: Secondary | ICD-10-CM | POA: Diagnosis not present

## 2020-08-10 DIAGNOSIS — G8929 Other chronic pain: Secondary | ICD-10-CM | POA: Diagnosis not present

## 2020-08-10 DIAGNOSIS — F0150 Vascular dementia without behavioral disturbance: Secondary | ICD-10-CM | POA: Diagnosis not present

## 2020-08-10 DIAGNOSIS — G309 Alzheimer's disease, unspecified: Secondary | ICD-10-CM | POA: Diagnosis not present

## 2020-08-10 DIAGNOSIS — F0280 Dementia in other diseases classified elsewhere without behavioral disturbance: Secondary | ICD-10-CM | POA: Diagnosis not present

## 2020-08-10 DIAGNOSIS — I10 Essential (primary) hypertension: Secondary | ICD-10-CM | POA: Diagnosis not present

## 2020-08-10 DIAGNOSIS — M1711 Unilateral primary osteoarthritis, right knee: Secondary | ICD-10-CM | POA: Diagnosis not present

## 2020-08-10 DIAGNOSIS — R627 Adult failure to thrive: Secondary | ICD-10-CM | POA: Diagnosis not present

## 2020-08-10 DIAGNOSIS — G43909 Migraine, unspecified, not intractable, without status migrainosus: Secondary | ICD-10-CM | POA: Diagnosis not present

## 2020-08-10 DIAGNOSIS — D649 Anemia, unspecified: Secondary | ICD-10-CM | POA: Diagnosis not present

## 2020-08-12 DIAGNOSIS — D649 Anemia, unspecified: Secondary | ICD-10-CM | POA: Diagnosis not present

## 2020-08-12 DIAGNOSIS — I10 Essential (primary) hypertension: Secondary | ICD-10-CM | POA: Diagnosis not present

## 2020-08-12 DIAGNOSIS — G43909 Migraine, unspecified, not intractable, without status migrainosus: Secondary | ICD-10-CM | POA: Diagnosis not present

## 2020-08-12 DIAGNOSIS — G8929 Other chronic pain: Secondary | ICD-10-CM | POA: Diagnosis not present

## 2020-08-12 DIAGNOSIS — F0280 Dementia in other diseases classified elsewhere without behavioral disturbance: Secondary | ICD-10-CM | POA: Diagnosis not present

## 2020-08-12 DIAGNOSIS — G309 Alzheimer's disease, unspecified: Secondary | ICD-10-CM | POA: Diagnosis not present

## 2020-08-12 DIAGNOSIS — R627 Adult failure to thrive: Secondary | ICD-10-CM | POA: Diagnosis not present

## 2020-08-12 DIAGNOSIS — F0150 Vascular dementia without behavioral disturbance: Secondary | ICD-10-CM | POA: Diagnosis not present

## 2020-08-12 DIAGNOSIS — M1711 Unilateral primary osteoarthritis, right knee: Secondary | ICD-10-CM | POA: Diagnosis not present

## 2020-08-16 DIAGNOSIS — G43909 Migraine, unspecified, not intractable, without status migrainosus: Secondary | ICD-10-CM | POA: Diagnosis not present

## 2020-08-16 DIAGNOSIS — M1711 Unilateral primary osteoarthritis, right knee: Secondary | ICD-10-CM | POA: Diagnosis not present

## 2020-08-16 DIAGNOSIS — I10 Essential (primary) hypertension: Secondary | ICD-10-CM | POA: Diagnosis not present

## 2020-08-16 DIAGNOSIS — D649 Anemia, unspecified: Secondary | ICD-10-CM | POA: Diagnosis not present

## 2020-08-16 DIAGNOSIS — F0150 Vascular dementia without behavioral disturbance: Secondary | ICD-10-CM | POA: Diagnosis not present

## 2020-08-16 DIAGNOSIS — G8929 Other chronic pain: Secondary | ICD-10-CM | POA: Diagnosis not present

## 2020-08-16 DIAGNOSIS — G309 Alzheimer's disease, unspecified: Secondary | ICD-10-CM | POA: Diagnosis not present

## 2020-08-16 DIAGNOSIS — F0280 Dementia in other diseases classified elsewhere without behavioral disturbance: Secondary | ICD-10-CM | POA: Diagnosis not present

## 2020-08-16 DIAGNOSIS — R627 Adult failure to thrive: Secondary | ICD-10-CM | POA: Diagnosis not present

## 2020-08-17 DIAGNOSIS — M1711 Unilateral primary osteoarthritis, right knee: Secondary | ICD-10-CM | POA: Diagnosis not present

## 2020-08-17 DIAGNOSIS — G8929 Other chronic pain: Secondary | ICD-10-CM | POA: Diagnosis not present

## 2020-08-17 DIAGNOSIS — G43909 Migraine, unspecified, not intractable, without status migrainosus: Secondary | ICD-10-CM | POA: Diagnosis not present

## 2020-08-17 DIAGNOSIS — R627 Adult failure to thrive: Secondary | ICD-10-CM | POA: Diagnosis not present

## 2020-08-17 DIAGNOSIS — G309 Alzheimer's disease, unspecified: Secondary | ICD-10-CM | POA: Diagnosis not present

## 2020-08-17 DIAGNOSIS — I10 Essential (primary) hypertension: Secondary | ICD-10-CM | POA: Diagnosis not present

## 2020-08-17 DIAGNOSIS — D649 Anemia, unspecified: Secondary | ICD-10-CM | POA: Diagnosis not present

## 2020-08-17 DIAGNOSIS — F0280 Dementia in other diseases classified elsewhere without behavioral disturbance: Secondary | ICD-10-CM | POA: Diagnosis not present

## 2020-08-17 DIAGNOSIS — F0150 Vascular dementia without behavioral disturbance: Secondary | ICD-10-CM | POA: Diagnosis not present

## 2020-08-18 DIAGNOSIS — M79671 Pain in right foot: Secondary | ICD-10-CM | POA: Diagnosis not present

## 2020-08-18 DIAGNOSIS — M7989 Other specified soft tissue disorders: Secondary | ICD-10-CM | POA: Diagnosis not present

## 2020-08-18 DIAGNOSIS — M25562 Pain in left knee: Secondary | ICD-10-CM | POA: Diagnosis not present

## 2020-08-18 DIAGNOSIS — G8929 Other chronic pain: Secondary | ICD-10-CM | POA: Diagnosis not present

## 2020-08-18 DIAGNOSIS — M19071 Primary osteoarthritis, right ankle and foot: Secondary | ICD-10-CM | POA: Diagnosis not present

## 2020-08-18 DIAGNOSIS — M25571 Pain in right ankle and joints of right foot: Secondary | ICD-10-CM | POA: Diagnosis not present

## 2020-08-18 DIAGNOSIS — M25561 Pain in right knee: Secondary | ICD-10-CM | POA: Diagnosis not present

## 2020-08-19 DIAGNOSIS — G8929 Other chronic pain: Secondary | ICD-10-CM | POA: Diagnosis not present

## 2020-08-19 DIAGNOSIS — G309 Alzheimer's disease, unspecified: Secondary | ICD-10-CM | POA: Diagnosis not present

## 2020-08-19 DIAGNOSIS — F0280 Dementia in other diseases classified elsewhere without behavioral disturbance: Secondary | ICD-10-CM | POA: Diagnosis not present

## 2020-08-19 DIAGNOSIS — I10 Essential (primary) hypertension: Secondary | ICD-10-CM | POA: Diagnosis not present

## 2020-08-19 DIAGNOSIS — M1711 Unilateral primary osteoarthritis, right knee: Secondary | ICD-10-CM | POA: Diagnosis not present

## 2020-08-19 DIAGNOSIS — F0150 Vascular dementia without behavioral disturbance: Secondary | ICD-10-CM | POA: Diagnosis not present

## 2020-08-19 DIAGNOSIS — G43909 Migraine, unspecified, not intractable, without status migrainosus: Secondary | ICD-10-CM | POA: Diagnosis not present

## 2020-08-19 DIAGNOSIS — D649 Anemia, unspecified: Secondary | ICD-10-CM | POA: Diagnosis not present

## 2020-08-19 DIAGNOSIS — R627 Adult failure to thrive: Secondary | ICD-10-CM | POA: Diagnosis not present

## 2020-08-23 DIAGNOSIS — D649 Anemia, unspecified: Secondary | ICD-10-CM | POA: Diagnosis not present

## 2020-08-23 DIAGNOSIS — R627 Adult failure to thrive: Secondary | ICD-10-CM | POA: Diagnosis not present

## 2020-08-23 DIAGNOSIS — G43909 Migraine, unspecified, not intractable, without status migrainosus: Secondary | ICD-10-CM | POA: Diagnosis not present

## 2020-08-23 DIAGNOSIS — I10 Essential (primary) hypertension: Secondary | ICD-10-CM | POA: Diagnosis not present

## 2020-08-23 DIAGNOSIS — F0150 Vascular dementia without behavioral disturbance: Secondary | ICD-10-CM | POA: Diagnosis not present

## 2020-08-23 DIAGNOSIS — G8929 Other chronic pain: Secondary | ICD-10-CM | POA: Diagnosis not present

## 2020-08-23 DIAGNOSIS — M1711 Unilateral primary osteoarthritis, right knee: Secondary | ICD-10-CM | POA: Diagnosis not present

## 2020-08-23 DIAGNOSIS — F0280 Dementia in other diseases classified elsewhere without behavioral disturbance: Secondary | ICD-10-CM | POA: Diagnosis not present

## 2020-08-23 DIAGNOSIS — G309 Alzheimer's disease, unspecified: Secondary | ICD-10-CM | POA: Diagnosis not present

## 2020-08-26 DIAGNOSIS — M1711 Unilateral primary osteoarthritis, right knee: Secondary | ICD-10-CM | POA: Diagnosis not present

## 2020-08-26 DIAGNOSIS — F0150 Vascular dementia without behavioral disturbance: Secondary | ICD-10-CM | POA: Diagnosis not present

## 2020-08-26 DIAGNOSIS — G43909 Migraine, unspecified, not intractable, without status migrainosus: Secondary | ICD-10-CM | POA: Diagnosis not present

## 2020-08-26 DIAGNOSIS — F0280 Dementia in other diseases classified elsewhere without behavioral disturbance: Secondary | ICD-10-CM | POA: Diagnosis not present

## 2020-08-26 DIAGNOSIS — I10 Essential (primary) hypertension: Secondary | ICD-10-CM | POA: Diagnosis not present

## 2020-08-26 DIAGNOSIS — G8929 Other chronic pain: Secondary | ICD-10-CM | POA: Diagnosis not present

## 2020-08-26 DIAGNOSIS — D649 Anemia, unspecified: Secondary | ICD-10-CM | POA: Diagnosis not present

## 2020-08-26 DIAGNOSIS — G309 Alzheimer's disease, unspecified: Secondary | ICD-10-CM | POA: Diagnosis not present

## 2020-08-26 DIAGNOSIS — R627 Adult failure to thrive: Secondary | ICD-10-CM | POA: Diagnosis not present

## 2020-08-30 ENCOUNTER — Emergency Department
Admission: EM | Admit: 2020-08-30 | Discharge: 2020-08-30 | Disposition: A | Discharge status: Home / Self Care | Payer: Medicare HMO | Attending: Emergency Medicine | Admitting: Emergency Medicine

## 2020-08-30 ENCOUNTER — Emergency Department: Payer: Medicare HMO

## 2020-08-30 ENCOUNTER — Other Ambulatory Visit: Payer: Self-pay

## 2020-08-30 DIAGNOSIS — I4811 Longstanding persistent atrial fibrillation: Secondary | ICD-10-CM | POA: Insufficient documentation

## 2020-08-30 DIAGNOSIS — R404 Transient alteration of awareness: Secondary | ICD-10-CM | POA: Diagnosis not present

## 2020-08-30 DIAGNOSIS — J984 Other disorders of lung: Secondary | ICD-10-CM | POA: Diagnosis not present

## 2020-08-30 DIAGNOSIS — M47816 Spondylosis without myelopathy or radiculopathy, lumbar region: Secondary | ICD-10-CM | POA: Diagnosis not present

## 2020-08-30 DIAGNOSIS — I10 Essential (primary) hypertension: Secondary | ICD-10-CM | POA: Diagnosis not present

## 2020-08-30 DIAGNOSIS — R0689 Other abnormalities of breathing: Secondary | ICD-10-CM | POA: Diagnosis not present

## 2020-08-30 DIAGNOSIS — W01198A Fall on same level from slipping, tripping and stumbling with subsequent striking against other object, initial encounter: Secondary | ICD-10-CM | POA: Insufficient documentation

## 2020-08-30 DIAGNOSIS — I6782 Cerebral ischemia: Secondary | ICD-10-CM | POA: Diagnosis not present

## 2020-08-30 DIAGNOSIS — R000 Tachycardia, unspecified: Secondary | ICD-10-CM | POA: Diagnosis not present

## 2020-08-30 DIAGNOSIS — Z7401 Bed confinement status: Secondary | ICD-10-CM | POA: Diagnosis not present

## 2020-08-30 DIAGNOSIS — M50221 Other cervical disc displacement at C4-C5 level: Secondary | ICD-10-CM | POA: Diagnosis not present

## 2020-08-30 DIAGNOSIS — S199XXA Unspecified injury of neck, initial encounter: Secondary | ICD-10-CM | POA: Diagnosis not present

## 2020-08-30 DIAGNOSIS — S0003XA Contusion of scalp, initial encounter: Secondary | ICD-10-CM | POA: Diagnosis not present

## 2020-08-30 DIAGNOSIS — G9389 Other specified disorders of brain: Secondary | ICD-10-CM | POA: Diagnosis not present

## 2020-08-30 DIAGNOSIS — F0390 Unspecified dementia without behavioral disturbance: Secondary | ICD-10-CM | POA: Insufficient documentation

## 2020-08-30 DIAGNOSIS — Z7901 Long term (current) use of anticoagulants: Secondary | ICD-10-CM | POA: Diagnosis not present

## 2020-08-30 DIAGNOSIS — M2550 Pain in unspecified joint: Secondary | ICD-10-CM | POA: Diagnosis not present

## 2020-08-30 DIAGNOSIS — M25551 Pain in right hip: Secondary | ICD-10-CM | POA: Diagnosis not present

## 2020-08-30 DIAGNOSIS — M47812 Spondylosis without myelopathy or radiculopathy, cervical region: Secondary | ICD-10-CM | POA: Diagnosis not present

## 2020-08-30 DIAGNOSIS — W19XXXA Unspecified fall, initial encounter: Secondary | ICD-10-CM | POA: Diagnosis not present

## 2020-08-30 DIAGNOSIS — S0990XA Unspecified injury of head, initial encounter: Secondary | ICD-10-CM | POA: Diagnosis present

## 2020-08-30 MED ORDER — DICLOFENAC SODIUM 1 % EX GEL: 2.0000 g | Freq: Four times a day (QID) | CUTANEOUS | 0 refills | Status: DC

## 2020-08-30 NOTE — ED Notes (Signed)
[facility]  CALLED  FOR  TRANSPORT  HOME

## 2020-08-30 NOTE — ED Notes (Signed)
Unit secretary will set up transport for pt

## 2020-08-30 NOTE — ED Triage Notes (Signed)
Pt BIB EMS from home after mechanical fall today. Pt hit head and has a hematoma to L side of forehead. Pt is on eliquis - no LOC per husband. Pt has dementia and afib at baseline. Received 500 ns bolus with EMS

## 2020-08-30 NOTE — ED Provider Notes (Signed)
[facility] Emergency Department Provider Note  ____________________________________________  Time seen: Approximately 1:56 PM  I have reviewed the triage vital signs and the nursing notes.   HISTORY  Chief Complaint Fall    Level 5 Caveat: Portions of the History and Physical including HPI and review of systems are unable to be completely obtained due to patient being a poor historian   HPI Laurie Jackson is a 79 y.o. female with past history of atrial fibrillation on Eliquis, dementia who was in her usual state of health until today when she had a trip and fall, hitting her forehead.  No loss of consciousness, denies any pain, no neck pain chest pain shortness of breath or leg pain.  Patient denies any other acute complaints.  No weakness or paresthesias.  Husband at bedside provides additional history, noting that she has had chronic right leg pain for the last month or 2, saw her doctor who put her on a medicine which was helping but has run out.  Reviewed EMR which shows this appears to be a prednisone taper which has been completed.      Past medical history Dementia Atrial fibrillation   There are no problems to display for this patient.    No past surgical history on file.   Prior to Admission medications   Medication Sig Start Date End Date Taking? Authorizing Provider  diclofenac Sodium (VOLTAREN) 1 % GEL Apply 2 g topically 4 (four) times daily. 08/30/20   Sharman Cheek, MD     Allergies Patient has no allergy information on record.   Family History  Problem Relation Age of Onset  . Breast cancer Neg Hx     Social History    Review of Systems Level 5 Caveat: Portions of the History and Physical including HPI and review of systems are unable to be completely obtained due to patient being a poor historian   Constitutional:   No known fever.  ENT:   No rhinorrhea. Cardiovascular:   No chest pain or syncope. Respiratory:    No dyspnea or cough. Gastrointestinal:   Negative for abdominal pain, vomiting and diarrhea.  Musculoskeletal:   Chronic right knee pain ____________________________________________   PHYSICAL EXAM:  VITAL SIGNS: ED Triage Vitals  Enc Vitals Group     BP 08/30/20 1159 (!) 144/86     Pulse Rate 08/30/20 1159 (!) 114     Resp 08/30/20 1159 (!) 37     Temp 08/30/20 1159 98.4 F (36.9 C)     Temp Source 08/30/20 1159 Oral     SpO2 08/30/20 1159 100 %     Weight --      Height --      Head Circumference --      Peak Flow --      Pain Score 08/30/20 1200 10     Pain Loc --      Pain Edu? --      Excl. in GC? --     Vital signs reviewed, nursing assessments reviewed.   Constitutional:   Awake and alert, not oriented. Non-toxic appearance. Eyes:   Conjunctivae are normal. EOMI. PERRL. ENT      Head:   Normocephalic with small soft tissue swelling over the left forehead, no laceration.      Nose:   No congestion/rhinnorhea.       Mouth/Throat:   MMM, no pharyngeal erythema. No peritonsillar mass.       Neck:   No meningismus. Full ROM.  Hematological/Lymphatic/Immunilogical:   No cervical lymphadenopathy. Cardiovascular:   Irregularly irregular rhythm. Symmetric bilateral radial and DP pulses.  No murmurs. Cap refill less than 2 seconds. Respiratory:   Normal respiratory effort without tachypnea/retractions. Breath sounds are clear and equal bilaterally. No wheezes/rales/rhonchi. Gastrointestinal:   Soft and nontender. Non distended. There is no CVA tenderness.  No rebound, rigidity, or guarding.  Musculoskeletal:   Normal range of motion in all extremities. No joint effusions.  No lower extremity tenderness.  No edema.  Slight pain with passive range of motion of bilateral hips, no limb shortening Neurologic:   Normal speech and language.  Motor grossly intact. No acute focal neurologic deficits are appreciated.  Skin:    Skin is warm, dry and intact. No rash noted.  No  petechiae, purpura, or bullae.  ____________________________________________    LABS (pertinent positives/negatives) (all labs ordered are listed, but only abnormal results are displayed) Labs Reviewed - No data to display ____________________________________________   EKG  Interpreted by me Atrial fibrillation, rate of 114.  Normal axis, poor R wave progression.  Normal ST segments and T waves, no ischemic changes  ____________________________________________    RADIOLOGY  CT HEAD WO CONTRAST  Result Date: 08/30/2020 CLINICAL DATA:  Trauma EXAM: CT HEAD WITHOUT CONTRAST CT CERVICAL SPINE WITHOUT CONTRAST TECHNIQUE: Multidetector CT imaging of the head and cervical spine was performed following the standard protocol without intravenous contrast. Multiplanar CT image reconstructions of the cervical spine were also generated. COMPARISON:  CT head July 23, 2014. FINDINGS: CT HEAD FINDINGS Brain: No evidence of acute large vascular territory infarction, hemorrhage, hydrocephalus, extra-axial collection or mass lesion/mass effect. Patchy white matter hypoattenuation, most likely related to chronic microvascular ischemic disease. Generalized cerebral volume loss with ex vacuo ventricular dilation. Vascular: Calcific atherosclerosis. Skull: Left frontal/periorbital scalp contusion without acute fracture. Sinuses/Orbits: Visualized sinuses are clear. Visualized orbits are unremarkable. Other: No mastoid effusions. CT CERVICAL SPINE FINDINGS Alignment: Mild stepwise anterolisthesis of C3 on C4 and C4 on C5 and slight retrolisthesis of C5 on C6, favored degenerative given facet arthropathy at these levels. Skull base and vertebrae: No evidence of acute fracture. Vertebral body heights are maintained. Soft tissues and spinal canal: No prevertebral fluid or swelling. No visible canal hematoma. Disc levels: Mild-to-moderate degenerative disc disease at C5-C6 with disc space height loss and bony  spurring. Multilevel facet arthropathy. Disc bulge at C4-C5 effaces ventral CSF and may contact the ventral cord. No evidence of significant bony canal stenosis. Upper chest: No acute findings. Biapical pleuroparenchymal scarring. IMPRESSION: CT head: 1. No evidence of acute intracranial abnormality. 2. Left frontal/periorbital scalp contusion without acute fracture. 3. Chronic microvascular ischemic disease and generalized cerebral volume loss. CT cervical spine: 1. No evidence of acute fracture or traumatic malalignment. 2. Multilevel degenerative change, as detailed above. Electronically Signed   By: Feliberto Harts MD   On: 08/30/2020 13:03   CT Cervical Spine Wo Contrast  Result Date: 08/30/2020 CLINICAL DATA:  Trauma EXAM: CT HEAD WITHOUT CONTRAST CT CERVICAL SPINE WITHOUT CONTRAST TECHNIQUE: Multidetector CT imaging of the head and cervical spine was performed following the standard protocol without intravenous contrast. Multiplanar CT image reconstructions of the cervical spine were also generated. COMPARISON:  CT head July 23, 2014. FINDINGS: CT HEAD FINDINGS Brain: No evidence of acute large vascular territory infarction, hemorrhage, hydrocephalus, extra-axial collection or mass lesion/mass effect. Patchy white matter hypoattenuation, most likely related to chronic microvascular ischemic disease. Generalized cerebral volume loss with ex vacuo ventricular dilation. Vascular: Calcific  atherosclerosis. Skull: Left frontal/periorbital scalp contusion without acute fracture. Sinuses/Orbits: Visualized sinuses are clear. Visualized orbits are unremarkable. Other: No mastoid effusions. CT CERVICAL SPINE FINDINGS Alignment: Mild stepwise anterolisthesis of C3 on C4 and C4 on C5 and slight retrolisthesis of C5 on C6, favored degenerative given facet arthropathy at these levels. Skull base and vertebrae: No evidence of acute fracture. Vertebral body heights are maintained. Soft tissues and spinal canal:  No prevertebral fluid or swelling. No visible canal hematoma. Disc levels: Mild-to-moderate degenerative disc disease at C5-C6 with disc space height loss and bony spurring. Multilevel facet arthropathy. Disc bulge at C4-C5 effaces ventral CSF and may contact the ventral cord. No evidence of significant bony canal stenosis. Upper chest: No acute findings. Biapical pleuroparenchymal scarring. IMPRESSION: CT head: 1. No evidence of acute intracranial abnormality. 2. Left frontal/periorbital scalp contusion without acute fracture. 3. Chronic microvascular ischemic disease and generalized cerebral volume loss. CT cervical spine: 1. No evidence of acute fracture or traumatic malalignment. 2. Multilevel degenerative change, as detailed above. Electronically Signed   By: Feliberto Harts MD   On: 08/30/2020 13:03   DG Hip Unilat W or Wo Pelvis 2-3 Views Right  Result Date: 08/30/2020 CLINICAL DATA:  Bilateral hip pain.  Mechanical fall today. EXAM: DG HIP (WITH OR WITHOUT PELVIS) 2-3V RIGHT COMPARISON:  None. FINDINGS: There is no evidence of hip fracture or dislocation. There is no evidence of arthropathy or other focal bone abnormality. Degenerative changes are noted in the lower lumbar spine. IMPRESSION: 1. Negative right hip. 2. Degenerative changes in the lower lumbar spine. Electronically Signed   By: Marin Roberts M.D.   On: 08/30/2020 13:09    ____________________________________________   PROCEDURES Procedures  ____________________________________________  DIFFERENTIAL DIAGNOSIS   Intracranial hemorrhage, C-spine fracture, pelvis fracture  CLINICAL IMPRESSION / ASSESSMENT AND PLAN / ED COURSE  Medications ordered in the ED: Medications - No data to display  Pertinent labs & imaging results that were available during my care of the patient were reviewed by me and considered in my medical decision making (see chart for details).   Carilyn Woolston was evaluated in Emergency  Department on 08/30/2020 for the symptoms described in the history of present illness. She was evaluated in the context of the global COVID-19 pandemic, which necessitated consideration that the patient might be at risk for infection with the SARS-CoV-2 virus that causes COVID-19. Institutional protocols and algorithms that pertain to the evaluation of patients at risk for COVID-19 are in a state of rapid change based on information released by regulatory bodies including the CDC and federal and state organizations. These policies and algorithms were followed during the patient's care in the ED.   Patient presents with mechanical fall, will obtain CT head neck and x-ray of the pelvis to evaluate for traumatic injuries.  Vital signs appear to be at baseline, not in distress, no other acute symptoms per husband at bedside.  Clinical Course as of 08/30/20 1356  Mon Aug 30, 2020  1306 Hip/pelvis XR viewed by me, no obvious fractures.  [PS]    Clinical Course User Index [PS] Sharman Cheek, MD     ----------------------------------------- 1:59 PM on 08/30/2020 -----------------------------------------  Work-up reassuring, no significant trauma.  She is tolerating p.o.,  Husband is comfortable taking patient home to follow-up with primary care.  ____________________________________________   FINAL CLINICAL IMPRESSION(S) / ED DIAGNOSES    Final diagnoses:  Longstanding persistent atrial fibrillation (HCC)  Contusion of scalp, initial encounter  Dementia without behavioral  disturbance, unspecified dementia type [facility](HCC)     ED Discharge Orders         Ordered    diclofenac Sodium (VOLTAREN) 1 % GEL  4 times daily        08/30/20 1356          Portions of this note were generated with dragon dictation software. Dictation errors may occur despite best attempts at proofreading.   Sharman CheekStafford, Ishmel Acevedo, MD 08/30/20 1359

## 2020-09-01 DIAGNOSIS — G309 Alzheimer's disease, unspecified: Secondary | ICD-10-CM | POA: Diagnosis not present

## 2020-09-01 DIAGNOSIS — M1711 Unilateral primary osteoarthritis, right knee: Secondary | ICD-10-CM | POA: Diagnosis not present

## 2020-09-01 DIAGNOSIS — F0150 Vascular dementia without behavioral disturbance: Secondary | ICD-10-CM | POA: Diagnosis not present

## 2020-09-01 DIAGNOSIS — G8929 Other chronic pain: Secondary | ICD-10-CM | POA: Diagnosis not present

## 2020-09-01 DIAGNOSIS — F0280 Dementia in other diseases classified elsewhere without behavioral disturbance: Secondary | ICD-10-CM | POA: Diagnosis not present

## 2020-09-01 DIAGNOSIS — G43909 Migraine, unspecified, not intractable, without status migrainosus: Secondary | ICD-10-CM | POA: Diagnosis not present

## 2020-09-01 DIAGNOSIS — D649 Anemia, unspecified: Secondary | ICD-10-CM | POA: Diagnosis not present

## 2020-09-01 DIAGNOSIS — R627 Adult failure to thrive: Secondary | ICD-10-CM | POA: Diagnosis not present

## 2020-09-01 DIAGNOSIS — I10 Essential (primary) hypertension: Secondary | ICD-10-CM | POA: Diagnosis not present

## 2020-09-02 DIAGNOSIS — G8929 Other chronic pain: Secondary | ICD-10-CM | POA: Diagnosis not present

## 2020-09-02 DIAGNOSIS — D649 Anemia, unspecified: Secondary | ICD-10-CM | POA: Diagnosis not present

## 2020-09-02 DIAGNOSIS — I10 Essential (primary) hypertension: Secondary | ICD-10-CM | POA: Diagnosis not present

## 2020-09-02 DIAGNOSIS — M1711 Unilateral primary osteoarthritis, right knee: Secondary | ICD-10-CM | POA: Diagnosis not present

## 2020-09-02 DIAGNOSIS — R627 Adult failure to thrive: Secondary | ICD-10-CM | POA: Diagnosis not present

## 2020-09-02 DIAGNOSIS — F0150 Vascular dementia without behavioral disturbance: Secondary | ICD-10-CM | POA: Diagnosis not present

## 2020-09-02 DIAGNOSIS — F0280 Dementia in other diseases classified elsewhere without behavioral disturbance: Secondary | ICD-10-CM | POA: Diagnosis not present

## 2020-09-02 DIAGNOSIS — G309 Alzheimer's disease, unspecified: Secondary | ICD-10-CM | POA: Diagnosis not present

## 2020-09-02 DIAGNOSIS — G43909 Migraine, unspecified, not intractable, without status migrainosus: Secondary | ICD-10-CM | POA: Diagnosis not present

## 2020-09-08 DIAGNOSIS — F0150 Vascular dementia without behavioral disturbance: Secondary | ICD-10-CM | POA: Diagnosis not present

## 2020-09-08 DIAGNOSIS — R627 Adult failure to thrive: Secondary | ICD-10-CM | POA: Diagnosis not present

## 2020-09-08 DIAGNOSIS — G309 Alzheimer's disease, unspecified: Secondary | ICD-10-CM | POA: Diagnosis not present

## 2020-09-08 DIAGNOSIS — I10 Essential (primary) hypertension: Secondary | ICD-10-CM | POA: Diagnosis not present

## 2020-09-08 DIAGNOSIS — F0280 Dementia in other diseases classified elsewhere without behavioral disturbance: Secondary | ICD-10-CM | POA: Diagnosis not present

## 2020-09-08 DIAGNOSIS — G8929 Other chronic pain: Secondary | ICD-10-CM | POA: Diagnosis not present

## 2020-09-08 DIAGNOSIS — M1711 Unilateral primary osteoarthritis, right knee: Secondary | ICD-10-CM | POA: Diagnosis not present

## 2020-09-08 DIAGNOSIS — D649 Anemia, unspecified: Secondary | ICD-10-CM | POA: Diagnosis not present

## 2020-09-08 DIAGNOSIS — G43909 Migraine, unspecified, not intractable, without status migrainosus: Secondary | ICD-10-CM | POA: Diagnosis not present

## 2020-09-19 DIAGNOSIS — G309 Alzheimer's disease, unspecified: Secondary | ICD-10-CM | POA: Diagnosis not present

## 2020-09-19 DIAGNOSIS — F0150 Vascular dementia without behavioral disturbance: Secondary | ICD-10-CM | POA: Diagnosis not present

## 2020-09-19 DIAGNOSIS — F0280 Dementia in other diseases classified elsewhere without behavioral disturbance: Secondary | ICD-10-CM | POA: Diagnosis not present

## 2020-09-20 DIAGNOSIS — F0150 Vascular dementia without behavioral disturbance: Secondary | ICD-10-CM | POA: Diagnosis not present

## 2020-09-20 DIAGNOSIS — F0280 Dementia in other diseases classified elsewhere without behavioral disturbance: Secondary | ICD-10-CM | POA: Diagnosis not present

## 2020-09-20 DIAGNOSIS — G309 Alzheimer's disease, unspecified: Secondary | ICD-10-CM | POA: Diagnosis not present

## 2020-09-20 DIAGNOSIS — Z79899 Other long term (current) drug therapy: Secondary | ICD-10-CM | POA: Diagnosis not present

## 2020-09-23 DIAGNOSIS — G309 Alzheimer's disease, unspecified: Secondary | ICD-10-CM | POA: Diagnosis not present

## 2020-09-23 DIAGNOSIS — D72829 Elevated white blood cell count, unspecified: Secondary | ICD-10-CM | POA: Diagnosis not present

## 2020-09-23 DIAGNOSIS — G43909 Migraine, unspecified, not intractable, without status migrainosus: Secondary | ICD-10-CM | POA: Diagnosis not present

## 2020-09-23 DIAGNOSIS — F0280 Dementia in other diseases classified elsewhere without behavioral disturbance: Secondary | ICD-10-CM | POA: Diagnosis not present

## 2020-09-23 DIAGNOSIS — I10 Essential (primary) hypertension: Secondary | ICD-10-CM | POA: Diagnosis not present

## 2020-09-23 DIAGNOSIS — F0150 Vascular dementia without behavioral disturbance: Secondary | ICD-10-CM | POA: Diagnosis not present

## 2020-09-23 DIAGNOSIS — R32 Unspecified urinary incontinence: Secondary | ICD-10-CM | POA: Diagnosis not present

## 2020-09-23 DIAGNOSIS — D649 Anemia, unspecified: Secondary | ICD-10-CM | POA: Diagnosis not present

## 2020-09-23 DIAGNOSIS — M1711 Unilateral primary osteoarthritis, right knee: Secondary | ICD-10-CM | POA: Diagnosis not present

## 2020-09-28 ENCOUNTER — Other Ambulatory Visit: Payer: Self-pay

## 2020-09-28 ENCOUNTER — Encounter: Payer: Self-pay | Admitting: Internal Medicine

## 2020-09-28 ENCOUNTER — Inpatient Hospital Stay
Admission: EM | Admit: 2020-09-28 | Discharge: 2020-10-08 | DRG: 871 | Disposition: A | Discharge status: Skilled Nursing Facility | Payer: Medicare HMO | Attending: Internal Medicine | Admitting: Internal Medicine

## 2020-09-28 ENCOUNTER — Emergency Department: Payer: Medicare HMO

## 2020-09-28 DIAGNOSIS — Z8673 Personal history of transient ischemic attack (TIA), and cerebral infarction without residual deficits: Secondary | ICD-10-CM

## 2020-09-28 DIAGNOSIS — Z90710 Acquired absence of both cervix and uterus: Secondary | ICD-10-CM | POA: Diagnosis not present

## 2020-09-28 DIAGNOSIS — I10 Essential (primary) hypertension: Secondary | ICD-10-CM | POA: Diagnosis present

## 2020-09-28 DIAGNOSIS — M6281 Muscle weakness (generalized): Secondary | ICD-10-CM | POA: Diagnosis not present

## 2020-09-28 DIAGNOSIS — M1990 Unspecified osteoarthritis, unspecified site: Secondary | ICD-10-CM | POA: Diagnosis present

## 2020-09-28 DIAGNOSIS — N3000 Acute cystitis without hematuria: Secondary | ICD-10-CM | POA: Diagnosis not present

## 2020-09-28 DIAGNOSIS — L89610 Pressure ulcer of right heel, unstageable: Secondary | ICD-10-CM | POA: Diagnosis not present

## 2020-09-28 DIAGNOSIS — Z7901 Long term (current) use of anticoagulants: Secondary | ICD-10-CM

## 2020-09-28 DIAGNOSIS — L89323 Pressure ulcer of left buttock, stage 3: Secondary | ICD-10-CM | POA: Diagnosis present

## 2020-09-28 DIAGNOSIS — L89620 Pressure ulcer of left heel, unstageable: Secondary | ICD-10-CM | POA: Diagnosis not present

## 2020-09-28 DIAGNOSIS — G003 Staphylococcal meningitis: Secondary | ICD-10-CM | POA: Diagnosis not present

## 2020-09-28 DIAGNOSIS — Z66 Do not resuscitate: Secondary | ICD-10-CM | POA: Diagnosis not present

## 2020-09-28 DIAGNOSIS — I471 Supraventricular tachycardia: Secondary | ICD-10-CM | POA: Diagnosis not present

## 2020-09-28 DIAGNOSIS — F0150 Vascular dementia without behavioral disturbance: Secondary | ICD-10-CM | POA: Diagnosis not present

## 2020-09-28 DIAGNOSIS — R6520 Severe sepsis without septic shock: Secondary | ICD-10-CM

## 2020-09-28 DIAGNOSIS — F0280 Dementia in other diseases classified elsewhere without behavioral disturbance: Secondary | ICD-10-CM | POA: Diagnosis not present

## 2020-09-28 DIAGNOSIS — I959 Hypotension, unspecified: Secondary | ICD-10-CM | POA: Diagnosis present

## 2020-09-28 DIAGNOSIS — Z7952 Long term (current) use of systemic steroids: Secondary | ICD-10-CM

## 2020-09-28 DIAGNOSIS — N390 Urinary tract infection, site not specified: Secondary | ICD-10-CM

## 2020-09-28 DIAGNOSIS — A419 Sepsis, unspecified organism: Secondary | ICD-10-CM

## 2020-09-28 DIAGNOSIS — L8995 Pressure ulcer of unspecified site, unstageable: Secondary | ICD-10-CM | POA: Diagnosis not present

## 2020-09-28 DIAGNOSIS — R0689 Other abnormalities of breathing: Secondary | ICD-10-CM | POA: Diagnosis not present

## 2020-09-28 DIAGNOSIS — I482 Chronic atrial fibrillation, unspecified: Secondary | ICD-10-CM | POA: Insufficient documentation

## 2020-09-28 DIAGNOSIS — L03317 Cellulitis of buttock: Secondary | ICD-10-CM | POA: Diagnosis not present

## 2020-09-28 DIAGNOSIS — D649 Anemia, unspecified: Secondary | ICD-10-CM | POA: Diagnosis not present

## 2020-09-28 DIAGNOSIS — R000 Tachycardia, unspecified: Secondary | ICD-10-CM | POA: Diagnosis not present

## 2020-09-28 DIAGNOSIS — I4891 Unspecified atrial fibrillation: Secondary | ICD-10-CM | POA: Diagnosis not present

## 2020-09-28 DIAGNOSIS — L089 Local infection of the skin and subcutaneous tissue, unspecified: Secondary | ICD-10-CM

## 2020-09-28 DIAGNOSIS — Z1629 Resistance to other single specified antibiotic: Secondary | ICD-10-CM | POA: Diagnosis not present

## 2020-09-28 DIAGNOSIS — R532 Functional quadriplegia: Secondary | ICD-10-CM | POA: Diagnosis not present

## 2020-09-28 DIAGNOSIS — Z880 Allergy status to penicillin: Secondary | ICD-10-CM

## 2020-09-28 DIAGNOSIS — R21 Rash and other nonspecific skin eruption: Secondary | ICD-10-CM | POA: Diagnosis not present

## 2020-09-28 DIAGNOSIS — G309 Alzheimer's disease, unspecified: Secondary | ICD-10-CM | POA: Diagnosis not present

## 2020-09-28 DIAGNOSIS — E785 Hyperlipidemia, unspecified: Secondary | ICD-10-CM | POA: Diagnosis present

## 2020-09-28 DIAGNOSIS — E2740 Unspecified adrenocortical insufficiency: Secondary | ICD-10-CM | POA: Diagnosis present

## 2020-09-28 DIAGNOSIS — L0390 Cellulitis, unspecified: Secondary | ICD-10-CM

## 2020-09-28 DIAGNOSIS — N179 Acute kidney failure, unspecified: Secondary | ICD-10-CM | POA: Diagnosis not present

## 2020-09-28 DIAGNOSIS — L89152 Pressure ulcer of sacral region, stage 2: Secondary | ICD-10-CM | POA: Diagnosis present

## 2020-09-28 DIAGNOSIS — D638 Anemia in other chronic diseases classified elsewhere: Secondary | ICD-10-CM | POA: Diagnosis present

## 2020-09-28 DIAGNOSIS — Z79899 Other long term (current) drug therapy: Secondary | ICD-10-CM | POA: Diagnosis not present

## 2020-09-28 DIAGNOSIS — L89153 Pressure ulcer of sacral region, stage 3: Secondary | ICD-10-CM | POA: Diagnosis not present

## 2020-09-28 DIAGNOSIS — E860 Dehydration: Secondary | ICD-10-CM | POA: Diagnosis present

## 2020-09-28 DIAGNOSIS — Z9081 Acquired absence of spleen: Secondary | ICD-10-CM

## 2020-09-28 DIAGNOSIS — R279 Unspecified lack of coordination: Secondary | ICD-10-CM | POA: Diagnosis not present

## 2020-09-28 DIAGNOSIS — Z20822 Contact with and (suspected) exposure to covid-19: Secondary | ICD-10-CM | POA: Diagnosis present

## 2020-09-28 DIAGNOSIS — L8990 Pressure ulcer of unspecified site, unspecified stage: Secondary | ICD-10-CM

## 2020-09-28 DIAGNOSIS — Z7401 Bed confinement status: Secondary | ICD-10-CM

## 2020-09-28 DIAGNOSIS — R5381 Other malaise: Secondary | ICD-10-CM | POA: Diagnosis not present

## 2020-09-28 DIAGNOSIS — A4151 Sepsis due to Escherichia coli [E. coli]: Principal | ICD-10-CM | POA: Diagnosis present

## 2020-09-28 DIAGNOSIS — F0390 Unspecified dementia without behavioral disturbance: Secondary | ICD-10-CM | POA: Diagnosis not present

## 2020-09-28 DIAGNOSIS — M1711 Unilateral primary osteoarthritis, right knee: Secondary | ICD-10-CM | POA: Diagnosis not present

## 2020-09-28 DIAGNOSIS — G43909 Migraine, unspecified, not intractable, without status migrainosus: Secondary | ICD-10-CM | POA: Diagnosis not present

## 2020-09-28 DIAGNOSIS — I480 Paroxysmal atrial fibrillation: Secondary | ICD-10-CM

## 2020-09-28 DIAGNOSIS — L89159 Pressure ulcer of sacral region, unspecified stage: Secondary | ICD-10-CM

## 2020-09-28 DIAGNOSIS — L89313 Pressure ulcer of right buttock, stage 3: Secondary | ICD-10-CM | POA: Diagnosis present

## 2020-09-28 DIAGNOSIS — R7881 Bacteremia: Secondary | ICD-10-CM | POA: Diagnosis not present

## 2020-09-28 DIAGNOSIS — B957 Other staphylococcus as the cause of diseases classified elsewhere: Secondary | ICD-10-CM | POA: Diagnosis not present

## 2020-09-28 DIAGNOSIS — B9620 Unspecified Escherichia coli [E. coli] as the cause of diseases classified elsewhere: Secondary | ICD-10-CM | POA: Diagnosis not present

## 2020-09-28 HISTORY — DX: Essential (primary) hypertension: I10

## 2020-09-28 HISTORY — DX: Unspecified dementia, unspecified severity, without behavioral disturbance, psychotic disturbance, mood disturbance, and anxiety: F03.90

## 2020-09-28 HISTORY — DX: Unspecified osteoarthritis, unspecified site: M19.90

## 2020-09-28 HISTORY — DX: Paroxysmal atrial fibrillation: I48.0

## 2020-09-28 LAB — CBC WITH DIFFERENTIAL/PLATELET
Abs Immature Granulocytes: 0.74 10*3/uL — ABNORMAL HIGH (ref 0.00–0.07)
Basophils Absolute: 0.1 10*3/uL (ref 0.0–0.1)
Basophils Relative: 1 %
Eosinophils Absolute: 0 10*3/uL (ref 0.0–0.5)
Eosinophils Relative: 0 %
HCT: 32.4 % — ABNORMAL LOW (ref 36.0–46.0)
Hemoglobin: 10.6 g/dL — ABNORMAL LOW (ref 12.0–15.0)
Immature Granulocytes: 5 %
Lymphocytes Relative: 24 %
Lymphs Abs: 3.4 10*3/uL (ref 0.7–4.0)
MCH: 28.3 pg (ref 26.0–34.0)
MCHC: 32.7 g/dL (ref 30.0–36.0)
MCV: 86.4 fL (ref 80.0–100.0)
Monocytes Absolute: 0.9 10*3/uL (ref 0.1–1.0)
Monocytes Relative: 7 %
Neutro Abs: 9.1 10*3/uL — ABNORMAL HIGH (ref 1.7–7.7)
Neutrophils Relative %: 63 %
Platelets: 629 10*3/uL — ABNORMAL HIGH (ref 150–400)
RBC: 3.75 MIL/uL — ABNORMAL LOW (ref 3.87–5.11)
RDW: 14.4 % (ref 11.5–15.5)
WBC: 14.2 10*3/uL — ABNORMAL HIGH (ref 4.0–10.5)
nRBC: 0 % (ref 0.0–0.2)

## 2020-09-28 LAB — URINALYSIS, COMPLETE (UACMP) WITH MICROSCOPIC
Bilirubin Urine: NEGATIVE
Glucose, UA: NEGATIVE mg/dL
Ketones, ur: NEGATIVE mg/dL
Nitrite: NEGATIVE
Protein, ur: 30 mg/dL — AB
Specific Gravity, Urine: 1.017 (ref 1.005–1.030)
pH: 5 (ref 5.0–8.0)

## 2020-09-28 LAB — COMPREHENSIVE METABOLIC PANEL
ALT: 17 U/L (ref 0–44)
AST: 17 U/L (ref 15–41)
Albumin: 3 g/dL — ABNORMAL LOW (ref 3.5–5.0)
Alkaline Phosphatase: 43 U/L (ref 38–126)
Anion gap: 15 (ref 5–15)
BUN: 48 mg/dL — ABNORMAL HIGH (ref 8–23)
CO2: 23 mmol/L (ref 22–32)
Calcium: 9.7 mg/dL (ref 8.9–10.3)
Chloride: 105 mmol/L (ref 98–111)
Creatinine, Ser: 2.15 mg/dL — ABNORMAL HIGH (ref 0.44–1.00)
GFR, Estimated: 23 mL/min — ABNORMAL LOW (ref >60)
Glucose, Bld: 115 mg/dL — ABNORMAL HIGH (ref 70–99)
Potassium: 4 mmol/L (ref 3.5–5.1)
Sodium: 143 mmol/L (ref 135–145)
Total Bilirubin: 0.9 mg/dL (ref 0.3–1.2)
Total Protein: 7.2 g/dL (ref 6.5–8.1)

## 2020-09-28 LAB — RESP PANEL BY RT-PCR (FLU A&B, COVID) ARPGX2
Influenza A by PCR: NEGATIVE
Influenza B by PCR: NEGATIVE
SARS Coronavirus 2 by RT PCR: NEGATIVE

## 2020-09-28 LAB — LACTIC ACID, PLASMA
Lactic Acid, Venous: 1.6 mmol/L (ref 0.5–1.9)
Lactic Acid, Venous: 2.7 mmol/L (ref 0.5–1.9)

## 2020-09-28 LAB — PROTIME-INR
INR: 1.9 — ABNORMAL HIGH (ref 0.8–1.2)
Prothrombin Time: 21.5 seconds — ABNORMAL HIGH (ref 11.4–15.2)

## 2020-09-28 LAB — APTT: aPTT: 38 seconds — ABNORMAL HIGH (ref 24–36)

## 2020-09-28 MED ORDER — VANCOMYCIN HCL IN DEXTROSE 1-5 GM/200ML-% IV SOLN
1000.0000 mg | Freq: Once | INTRAVENOUS | Status: AC
  Administered 2020-09-28: 1000 mg via INTRAVENOUS
  Filled 2020-09-28: qty 200

## 2020-09-28 MED ORDER — ACETAMINOPHEN 325 MG PO TABS: 650.0000 mg | ORAL_TABLET | Freq: Four times a day (QID) | ORAL | Status: DC | PRN

## 2020-09-28 MED ORDER — LACTATED RINGERS IV SOLN: INTRAVENOUS | Status: DC

## 2020-09-28 MED ORDER — APIXABAN 2.5 MG PO TABS
2.5000 mg | ORAL_TABLET | Freq: Two times a day (BID) | ORAL | Status: DC
  Administered 2020-09-29 – 2020-10-08 (×19): 2.5 mg via ORAL
  Filled 2020-09-28 (×22): qty 1

## 2020-09-28 MED ORDER — VANCOMYCIN HCL IN DEXTROSE 1-5 GM/200ML-% IV SOLN: 1000.0000 mg | Freq: Once | INTRAVENOUS | Status: DC

## 2020-09-28 MED ORDER — SENNOSIDES-DOCUSATE SODIUM 8.6-50 MG PO TABS: 1.0000 | ORAL_TABLET | Freq: Every evening | ORAL | Status: DC | PRN

## 2020-09-28 MED ORDER — DONEPEZIL HCL 5 MG PO TABS
10.0000 mg | ORAL_TABLET | Freq: Every day | ORAL | Status: DC
  Administered 2020-09-29 – 2020-10-08 (×10): 10 mg via ORAL
  Filled 2020-09-28 (×10): qty 2

## 2020-09-28 MED ORDER — ONDANSETRON HCL 4 MG/2ML IJ SOLN: 4.0000 mg | Freq: Four times a day (QID) | INTRAMUSCULAR | Status: DC | PRN

## 2020-09-28 MED ORDER — VANCOMYCIN VARIABLE DOSE PER UNSTABLE RENAL FUNCTION (PHARMACIST DOSING): Status: DC

## 2020-09-28 MED ORDER — ACETAMINOPHEN 650 MG RE SUPP: 650.0000 mg | Freq: Four times a day (QID) | RECTAL | Status: DC | PRN

## 2020-09-28 MED ORDER — SODIUM CHLORIDE 0.9 % IV BOLUS
500.0000 mL | Freq: Once | INTRAVENOUS | Status: AC
  Administered 2020-09-28: 500 mL via INTRAVENOUS

## 2020-09-28 MED ORDER — LACTATED RINGERS IV BOLUS
1000.0000 mL | Freq: Once | INTRAVENOUS | Status: AC
  Administered 2020-09-28: 1000 mL via INTRAVENOUS

## 2020-09-28 MED ORDER — LEVOFLOXACIN IN D5W 750 MG/150ML IV SOLN
750.0000 mg | Freq: Once | INTRAVENOUS | Status: AC
  Administered 2020-09-28: 750 mg via INTRAVENOUS
  Filled 2020-09-28: qty 150

## 2020-09-28 MED ORDER — SODIUM CHLORIDE 0.9 % IV SOLN
2.0000 g | INTRAVENOUS | Status: DC
  Administered 2020-09-29 – 2020-10-04 (×7): 2 g via INTRAVENOUS
  Filled 2020-09-28 (×3): qty 2
  Filled 2020-09-28 (×2): qty 20
  Filled 2020-09-28: qty 2
  Filled 2020-09-28: qty 20

## 2020-09-28 MED ORDER — ONDANSETRON HCL 4 MG PO TABS: 4.0000 mg | ORAL_TABLET | Freq: Four times a day (QID) | ORAL | Status: DC | PRN

## 2020-09-28 NOTE — Consult Note (Signed)
Pharmacy Antibiotic Note  Laurie Jackson is a 80 y.o. female admitted on 09/28/2020 for wound check   Pharmacy has been consulted for Vancomycin dosing for cellulitis. Patient does have elevated Scr -- unsure of baseline renal function. Most recent  creatinine from care everywhere on 09/20/20 was 1.0.   Of note: appears patient takes reduced dose apixaban for Afib  Plan: --Vancomycin 1000 mg IV LD x 1 ordered in ED --Will hold off on scheduled regimen given current renal function and re-evaluate renal function tomorrow to determine if it remains stable. At current renal function, anticipate a Q36H regimen.  --daily BMP    Height: 5\' 7"  (170.2 cm) Weight: 49.9 kg (110 lb) IBW/kg (Calculated) : 61.6  Temp (24hrs), Avg:99.5 F (37.5 C), Min:99.5 F (37.5 C), Max:99.5 F (37.5 C)  Recent Labs  Lab 09/28/20 1811 09/28/20 1816  WBC 14.2*  --   CREATININE 2.15*  --   LATICACIDVEN  --  1.6    Estimated Creatinine Clearance: 17.3 mL/min (A) (by C-G formula based on SCr of 2.15 mg/dL (H)).    Allergies  Allergen Reactions  . Penicillin G Other (See Comments)    Drunk feeling/weak    Antimicrobials this admission: Ceftriaxone >> 1/18 > Levaquin  >> 1/18 x 1  Dose adjustments this admission: n/a  Microbiology results: n/a  Thank you for allowing pharmacy to be a part of this patient's care.  2/18, PharmD, BCPS Clinical Pharmacist  09/28/2020 8:26 PM

## 2020-09-28 NOTE — ED Notes (Signed)
Date and time results received: 09/28/20 10:20 PM   Test: Lactic Acid Critical Value: 2.7  Name of Provider Notified: Attending notified   Orders Received? Or Actions Taken?: Attending notified.

## 2020-09-28 NOTE — Consult Note (Signed)
PHARMACY -  BRIEF ANTIBIOTIC NOTE   Pharmacy has received consult(s) for Vancomycin from an ED provider.  The patient's profile has been reviewed for ht/wt/allergies/indication/available labs.    One time order(s) placed for: Vancomycin 1,000 mg IV x 1  Further antibiotics/pharmacy consults should be ordered by admitting physician if indicated.                       Thank you, Sharen Hones, PharmD, BCPS Clinical Pharmacist  09/28/2020  7:20 PM

## 2020-09-28 NOTE — ED Provider Notes (Signed)
[facility] Emergency Department Provider Note   ____________________________________________   Event Date/Time   First MD Initiated Contact with Patient 09/28/20 1754     (approximate)  I have reviewed the triage vital signs and the nursing notes.   HISTORY  Chief Complaint Wound Check (Possible decub )  EM caveat dementia  HPI Laurie Jackson is a 80 y.o. female   with a history of dementia, fairly bedbound  Patient following with primary for foot wounds, sees Dr. Judithann Sheen and they have noticed over the last week she has had a fairly quickly progressing rash over her buttock.  She is not complaining of pain but today felt so weak she did not really want to eat anything  No recent illness other than bandages applied to the back of both feet and is developing wound on her buttock  No past medical history on file.  Patient Active Problem List   Diagnosis Date Noted  . Severe sepsis (HCC) 09/28/2020  . AKI (acute kidney injury) (HCC) 09/28/2020  . Sacral decubitus ulcer 09/28/2020  . Cellulitis 09/28/2020  . Atrial fibrillation, chronic (HCC) 09/28/2020  . AF (paroxysmal atrial fibrillation) (HCC) 09/28/2020  . Chronic anticoagulation 09/28/2020  . Functional quadriplegia (HCC) 09/28/2020  . Lacunar infarction (HCC) 12/03/2019  . Mixed Alzheimer's and vascular dementia (HCC) 12/03/2019  . Chronic anemia 12/19/2018  . Hand pain 05/29/2016  . Primary osteoarthritis of right knee 12/29/2014  . Benign essential hypertension 02/13/2014  . Pure hypercholesterolemia 02/13/2014    No past surgical history on file.  Prior to Admission medications   Medication Sig Start Date End Date Taking? Authorizing Provider  apixaban (ELIQUIS) 2.5 MG TABS tablet TAKE 1 TABLET (2.5 MG TOTAL) BY MOUTH EVERY 12 (TWELVE) HOURS 08/02/20  Yes [provider]  simvastatin (ZOCOR) 40 MG tablet simvastatin 40 mg tablet 02/11/19  Yes [provider]   diclofenac Sodium (VOLTAREN) 1 % GEL Apply 2 g topically 4 (four) times daily. 08/30/20   Sharman Cheek, MD  donepezil (ARICEPT) 10 MG tablet Take 10 mg by mouth daily. 09/20/20   [provider]  ferrous sulfate 325 (65 FE) MG tablet Take 325 mg by mouth 2 (two) times daily. 07/14/20   [provider]  losartan-hydrochlorothiazide (HYZAAR) 100-25 MG tablet Take by mouth. 08/23/20   [provider]  megestrol (MEGACE) 40 MG/ML suspension Take by mouth. 09/13/20   [provider]  Multiple Vitamin (MULTI-VITAMIN) tablet Take 1 tablet by mouth daily.    [provider]  predniSONE (DELTASONE) 10 MG tablet Take by mouth. 08/18/20   [provider]  propranolol (INDERAL) 20 MG tablet Take 20 mg by mouth daily. 07/10/20   [provider]  valsartan-hydrochlorothiazide (DIOVAN-HCT) 160-25 MG tablet valsartan 160 mg-hydrochlorothiazide 25 mg tablet    [provider]    Allergies Penicillin g  Family History  Problem Relation Age of Onset  . Breast cancer Neg Hx     Social History    Review of Systems  EM caveat  Daughter reports mother still interactive baseline with regard to her mentation but fatigued and not eating well at all for the last day and seemingly getting more weak in the last several days   ____________________________________________   PHYSICAL EXAM:  VITAL SIGNS: ED Triage Vitals [09/28/20 1824]  Enc Vitals Group     BP 96/70     Pulse Rate (!) 108     Resp 18     Temp 99.5  F (37.5 C)     Temp Source Rectal     SpO2 94 %     Weight 110 lb (49.9 kg)     Height 5\' 7"  (1.702 m)     Head Circumference      Peak Flow      Pain Score      Pain Loc      Pain Edu?      Excl. in GC?     Constitutional: Alert and oriented to self but not to place or date.  She appears well cared for but very frail.  Daughter very pleasant at bedside Eyes: Conjunctivae are normal. Head: Atraumatic. Nose:  No congestion/rhinnorhea. Mouth/Throat: Mucous membranes are dry. Neck: No stridor.  Cardiovascular: Slightly tachycardic rate, regular rhythm. Grossly normal heart sounds.  Good peripheral circulation. Respiratory: Slightly tachypneic but without distress.  No retractions. Lungs CTAB. Gastrointestinal: Soft and nontender. No distention. Musculoskeletal: No lower extremity tenderness nor edema.  She has 2 small pressure sores at the back of each heel.  Logrolled on her back appears well except for around her buttock where she has appears to be primarily stage II pressure sore with some surrounding erythema and slight purulent appearance about the size of a palm overlying the sacral decubitus region Neurologic:  Normal speech and language very frail. No gross focal neurologic deficits are appreciated.  Skin:  Skin is warm, dry and intact. No rash noted. Psychiatric: Mood and affect are calm and pleasant  ____________________________________________   LABS (all labs ordered are listed, but only abnormal results are displayed)  Labs Reviewed  COMPREHENSIVE METABOLIC PANEL - Abnormal; Notable for the following components:      Result Value   Glucose, Bld 115 (*)    BUN 48 (*)    Creatinine, Ser 2.15 (*)    Albumin 3.0 (*)    GFR, Estimated 23 (*)    All other components within normal limits  CBC WITH DIFFERENTIAL/PLATELET - Abnormal; Notable for the following components:   WBC 14.2 (*)    RBC 3.75 (*)    Hemoglobin 10.6 (*)    HCT 32.4 (*)    Platelets 629 (*)    Neutro Abs 9.1 (*)    Abs Immature Granulocytes 0.74 (*)    All other components within normal limits  PROTIME-INR - Abnormal; Notable for the following components:   Prothrombin Time 21.5 (*)    INR 1.9 (*)    All other components within normal limits  APTT - Abnormal; Notable for the following components:   aPTT 38 (*)    All other components within normal limits  RESP PANEL BY RT-PCR (FLU A&B, COVID) ARPGX2  CULTURE,  BLOOD (SINGLE)  URINE CULTURE  CULTURE, BLOOD (SINGLE)  LACTIC ACID, PLASMA  LACTIC ACID, PLASMA  URINALYSIS, COMPLETE (UACMP) WITH MICROSCOPIC  BASIC METABOLIC PANEL   ____________________________________________  EKG  Reviewed inter by me at 1900 Heart rate 110 QRS 79 QTc 430 Atrial fibrillation, rapid ventricular response.  No evidence of acute ischemia.  Known A. fib ____________________________________________  RADIOLOGY  DG Chest Port 1 View  Result Date: 09/28/2020 CLINICAL DATA:  Pressure wounds EXAM: PORTABLE CHEST 1 VIEW COMPARISON:  07/23/2014 FINDINGS: The heart size and mediastinal contours are within normal limits. Both lungs are clear. The visualized skeletal structures are unremarkable. IMPRESSION: No active disease. Electronically Signed   By: 13/08/2014 M.D.   On: 09/28/2020 19:22    Chest x-ray reviewed negative for acute ____________________________________________  PROCEDURES  Procedure(s) performed: None  Procedures  Critical Care performed: Yes, see critical care note(s)  CRITICAL CARE Performed by: Sharyn Creamer   Total critical care time: 35 minutes  Critical care time was exclusive of separately billable procedures and treating other patients.  Critical care was necessary to treat or prevent imminent or life-threatening deterioration.  Critical care was time spent personally by me on the following activities: development of treatment plan with patient and/or surrogate as well as nursing, discussions with consultants, evaluation of patient's response to treatment, examination of patient, obtaining history from patient or surrogate, ordering and performing treatments and interventions, ordering and review of laboratory studies, ordering and review of radiographic studies, pulse oximetry and re-evaluation of patient's condition.  ____________________________________________   INITIAL IMPRESSION / ASSESSMENT AND PLAN / ED COURSE  Pertinent  labs & imaging results that were available during my care of the patient were reviewed by me and considered in my medical decision making (see chart for details).   Patient presents for worsening pressure sore that appears to developed in the last week of her sacrum with associated fatigue and weakness decreased appetite.  No systemic symptoms noted by family and her mental status is baseline.  Reassuring chest and abdominal exam.  No respiratory complaint.  She does have evidence of worsening pressure sore with superinfection over the sacral decubitus region.  We will start broad-spectrum antibiotics as she has mild hypotension low-grade fever tachycardia and leukocytosis to noted at this point that 6:46 PM  Findings appear consistent with sepsis.  Awaiting further work-up but anticipate need for hospitalization.  Daughter who reports also healthcare power of attorney at bedside, reinforcing that comfort is also very important of the patient's care.  Thus we discussed risks of aspiration associated with eating but she would like for her mother to be able to have a soft diet    ED Sepsis - Repeat Assessment   Performed at:    745pm  Last Vitals:    Blood pressure 96/70, pulse (!) 108, temperature 99.5 F (37.5 C), temperature source Rectal, resp. rate 18, height 5\' 7"  (1.702 m), weight 49.9 kg, SpO2 94 %.  Heart:      Clear tones, normal capillary refill  Lungs:     No stress  Patient to be admitted to hospitalist service, case and care discussed with Dr. .  Family updated at bedside as well  ____________________________________________   FINAL CLINICAL IMPRESSION(S) / ED DIAGNOSES  Final diagnoses:  Sepsis, due to unspecified organism, unspecified whether acute organ dysfunction present (HCC)  Wound infection  Pressure injury of skin, unspecified injury stage, unspecified location        Note:  This document was prepared using Dragon voice recognition software and may  include unintentional dictation errors       Para March, MD 09/28/20 2013

## 2020-09-28 NOTE — H&P (Addendum)
History and Physical    [name] ZOX:[id number] DOB: Jan 15, 1941 DOA: 09/28/2020  PCP: Marguarite Arbour, MD   Patient coming from: home  I have personally briefly reviewed patient's old medical records in [facility]  Chief Complaint: rash on buttock, weakness, not eating  HPI: Laurie Jackson is a 80 y.o. female with medical history significant for Paroxysmal A. fib on Eliquis, Alzheimer's disease and total care dependent, hypertension, hx of lacunar infarct and osteoarthritis who presented to the emergency room on the advice of her PCP after she developed an area of skin breakdown on her buttock a week ago that has been rapidly progressing.  Most of the history is taken from patient's daughter, Winston Sobczyk due to her mother's dementia.  She states that her mother has been rapidly declining over the past 2 months and currently she needs assistance for all activities of daily living including feeding, grooming and transfers.  Daughter has moved to live with her parents to assist her father and providing full-time care to her mother .  She states that over the past couple weeks her mother has  been very weak and has been spending most of the time in the bed. Physical therapy was ordered however patient was unable to participate with them.  Over the past week they noticed the area of skin blistering and was thus advised by the PCP to present to the emergency room.  ED course: On arrival patient had a low-grade temperature of 99.5 and was hypotensive at 96/70 and tachycardic at 108 with O2 sat 94% on room air.  Her blood pressure responded to fluid bolus and improved to 129/75 but patient remained tachycardic with respirations 27, O2 sat 86% on room air blood work significant for WBC of 14,000, with normal lactic acid of 1.6.  Chemistry significant for creatinine of 2.15, up from baseline of 1.0 at her doctor's visit on 1/10.  Hemoglobin 10.6 which is her baseline.  Urinalysis  pending. EKG as interpreted by me: A. fib with RVR at 110 Imaging: Chest x-ray clear  Patient was given a fluid bolus and started on IV antibiotics.  Hospitalist consulted for admission. Following admission, urinalysis showed many bacteria though with trace leukocyte esterase  Review of Systems: Unable to obtain due to dementia  Past Medical History:  Diagnosis Date  . Dementia (HCC)   . HTN (hypertension)   . OA (osteoarthritis)   . Paroxysmal A-fib [facility])     Past Surgical History:  Procedure Laterality Date  . ABDOMINAL HYSTERECTOMY       reports that she has never smoked. She has never used smokeless tobacco. She reports that she does not drink alcohol. No history on file for drug use.  Allergies  Allergen Reactions  . Penicillin G Other (See Comments)    Drunk feeling/weak    Family History  Problem Relation Age of Onset  . Breast cancer Neg Hx       Prior to Admission medications   Medication Sig Start Date End Date Taking? Authorizing Provider  apixaban (ELIQUIS) 2.5 MG TABS tablet TAKE 1 TABLET (2.5 MG TOTAL) BY MOUTH EVERY 12 (TWELVE) HOURS 08/02/20  Yes [provider]  simvastatin (ZOCOR) 40 MG tablet simvastatin 40 mg tablet 02/11/19  Yes [provider]  diclofenac Sodium (VOLTAREN) 1 % GEL Apply 2 g topically 4 (four) times daily. 08/30/20   Sharman Cheek, MD  donepezil (ARICEPT) 10 MG tablet Take 10 mg by mouth daily. 09/20/20  [provider]  ferrous sulfate 325 (65 FE) MG tablet Take 325 mg by mouth 2 (two) times daily. 07/14/20   [provider]  losartan-hydrochlorothiazide (HYZAAR) 100-25 MG tablet Take by mouth. 08/23/20   [provider]  megestrol (MEGACE) 40 MG/ML suspension Take by mouth. 09/13/20   [provider]  Multiple Vitamin (MULTI-VITAMIN) tablet Take 1 tablet by mouth daily.    [provider]  predniSONE (DELTASONE) 10 MG tablet Take by mouth. 08/18/20   [provider]  propranolol (INDERAL) 20 MG tablet Take 20 mg by mouth daily. 07/10/20   [provider]  valsartan-hydrochlorothiazide (DIOVAN-HCT) 160-25 MG tablet valsartan 160 mg-hydrochlorothiazide 25 mg tablet    [provider]    Physical Exam: Vitals:   09/28/20 1824  BP: 96/70  Pulse: (!) 108  Resp: 18  Temp: 99.5 F (37.5 C)  TempSrc: Rectal  SpO2: 94%  Weight: 49.9 kg  Height: 5\' 7"  (1.702 m)     Vitals:   09/28/20 1824  BP: 96/70  Pulse: (!) 108  Resp: 18  Temp: 99.5 F (37.5 C)  TempSrc: Rectal  SpO2: 94%  Weight: 49.9 kg  Height: 5\' 7"  (1.702 m)      Constitutional:  Pleasant but frail appearing and oriented x 1 . Not in any apparent distress HEENT:      Head: Normocephalic and atraumatic.         Eyes: PERLA, EOMI, Conjunctivae are normal. Sclera is non-icteric.       Mouth/Throat: Mucous membranes are moist.       Neck: Supple with no signs of meningismus. Cardiovascular:  Irregular, tachycardic. No murmurs, gallops, or rubs. 2+ symmetrical distal pulses are present . No JVD. No LE edema Respiratory:  Mildly tachypneic.Lungs sounds clear bilaterally. No wheezes, crackles, or rhonchi.  Gastrointestinal: Soft, non tender, and non distended with positive bowel sounds.  Genitourinary: No CVA tenderness. Musculoskeletal: Nontender with normal range of motion in all extremities. No cyanosis, or erythema of extremities. Neurologic:  Face is symmetric. Moving all extremities. No gross focal neurologic deficits . Skin:  Large area of denuded skin about 10 cm diameter medial left buttock, and another small area over right ischium about 2 cm, with surrounding redness psychiatric: Mood and affect are normal    Labs on Admission: I have personally reviewed following labs and imaging studies  CBC: Recent Labs  Lab 09/28/20 1811  WBC 14.2*  NEUTROABS 9.1*  HGB 10.6*  HCT 32.4*  MCV 86.4  PLT 629*   Basic Metabolic Panel: Recent Labs  Lab  09/28/20 1811  NA 143  K 4.0  CL 105  CO2 23  GLUCOSE 115*  BUN 48*  CREATININE 2.15*  CALCIUM 9.7   GFR: Estimated Creatinine Clearance: 17.3 mL/min (A) (by C-G formula based on SCr of 2.15 mg/dL (H)). Liver Function Tests: Recent Labs  Lab 09/28/20 1811  AST 17  ALT 17  ALKPHOS 43  BILITOT 0.9  PROT 7.2  ALBUMIN 3.0*   No results for input(s): LIPASE, AMYLASE in the last 168 hours. No results for input(s): AMMONIA in the last 168 hours. Coagulation Profile: Recent Labs  Lab 09/28/20 1811  INR 1.9*   Cardiac Enzymes: No results for input(s): CKTOTAL, CKMB, CKMBINDEX, TROPONINI in the last 168 hours. BNP (last 3 results) No results for input(s): PROBNP in the last 8760 hours. HbA1C: No results for input(s): HGBA1C in the last 72 hours. CBG: No results for input(s): GLUCAP in the  last 168 hours. Lipid Profile: No results for input(s): CHOL, HDL, LDLCALC, TRIG, CHOLHDL, LDLDIRECT in the last 72 hours. Thyroid Function Tests: No results for input(s): TSH, T4TOTAL, FREET4, T3FREE, THYROIDAB in the last 72 hours. Anemia Panel: No results for input(s): VITAMINB12, FOLATE, FERRITIN, TIBC, IRON, RETICCTPCT in the last 72 hours. Urine analysis:    Component Value Date/Time   COLORURINE Amber 07/23/2014 1129   APPEARANCEUR Hazy 07/23/2014 1129   LABSPEC 1.023 07/23/2014 1129   PHURINE 6.0 07/23/2014 1129   GLUCOSEU Negative 07/23/2014 1129   HGBUR Negative 07/23/2014 1129   BILIRUBINUR Negative 07/23/2014 1129   KETONESUR Negative 07/23/2014 1129   PROTEINUR Negative 07/23/2014 1129   NITRITE Negative 07/23/2014 1129   LEUKOCYTESUR Trace 07/23/2014 1129    Radiological Exams on Admission: DG Chest Port 1 View  Result Date: 09/28/2020 CLINICAL DATA:  Pressure wounds EXAM: PORTABLE CHEST 1 VIEW COMPARISON:  07/23/2014 FINDINGS: The heart size and mediastinal contours are within normal limits. Both lungs are clear. The visualized skeletal structures are  unremarkable. IMPRESSION: No active disease. Electronically Signed   By: Jasmine Pang M.D.   On: 09/28/2020 19:22     Assessment/Plan 80 year old female with history of paroxysmal A. fib on Eliquis, Alzheimer's disease and total care dependent, hypertension, hx of lacunar infarct and osteoarthritis sent from PCP with concern for sacral skin breakdown associated with decreased oral intake and generalized weakness.      Severe sepsis (HCC)   Cellulitis/Blisters/ infected decubitus left buttock   UTI  - Patient  meeting sepsis criteria including leukocytosis, hypotension, tachycardia, AKI and tachypnea - Sepsis source, suspect cellulitis/infected decubitus ulcer as well as UTI - Received IV fluid bolus in the emergency room - Continue IV fluids - IV vancomycin and Rocephin(has PCN adverse effect of weakness) - Wound care consult    AKI (acute kidney injury) (HCC) - Creatinine 2.15, up from 1.0 on 1/10 at doctor's office - Likely  related to combination of sepsis and decreased oral intake - Continue IV hydration - Monitor renal function and avoid nephrotoxins    AF (paroxysmal atrial fibrillation) with RVR   Chronic anticoagulation - Ventricular response increase in the 1 teens but likely secondary to sepsis - Expecting improvement with hydration and sepsis management - Continue Eliquis.  We will hold home propranolol due to hypotension on arrival    Benign essential hypertension - Patient with soft blood pressures related to sepsis on arrival - Hold antihypertensives and resume as appropriate    Chronic anemia - Hemoglobin 10.6 which is baseline    Mixed Alzheimer's and vascular dementia (HCC)   Functional quadriplegia (HCC) - According to daughter, patient is total care dependent and needs assistance in all ADLs - Dysphagia 2 diet and nutritionist eval - TOC consulted. Daughter would be interested in skilled rehab but states her father might be less interested - Fall and  aspiration precautions and increase nursing assistance    History of lacunar cerebrovascular accident - Resume statins    DVT prophylaxis: Eliquis Code Status: DNR as discussed with daughter.  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 This is judged to be reasonable and necessary in order to provide the required intensity of service to ensure the  patient's safety given the presenting symptoms, physical exam findings, and initial radiographic and laboratory data in the context of their  Comorbid conditions.   Patient requires inpatient status due to high intensity of service, high risk for further deterioration and high frequency of surveillance required.   I certify that at the point of admission it is my clinical judgment that the patient will require inpatient hospital care spanning beyond 2 midnights     Andris Baumann MD [facility]     09/28/2020, 8:18 PM

## 2020-09-28 NOTE — ED Triage Notes (Signed)
Pt sent by daughter for sores to bottom. Upon inspection, appears to have developing pressure wounds to sacral area. Pt noted to have burst blisters to bilat buttocks. No prulent drainage noted.

## 2020-09-29 DIAGNOSIS — M1711 Unilateral primary osteoarthritis, right knee: Secondary | ICD-10-CM | POA: Diagnosis not present

## 2020-09-29 DIAGNOSIS — G43909 Migraine, unspecified, not intractable, without status migrainosus: Secondary | ICD-10-CM | POA: Diagnosis not present

## 2020-09-29 DIAGNOSIS — F0280 Dementia in other diseases classified elsewhere without behavioral disturbance: Secondary | ICD-10-CM | POA: Diagnosis not present

## 2020-09-29 DIAGNOSIS — N390 Urinary tract infection, site not specified: Secondary | ICD-10-CM

## 2020-09-29 DIAGNOSIS — F0150 Vascular dementia without behavioral disturbance: Secondary | ICD-10-CM | POA: Diagnosis not present

## 2020-09-29 DIAGNOSIS — G309 Alzheimer's disease, unspecified: Secondary | ICD-10-CM | POA: Diagnosis not present

## 2020-09-29 DIAGNOSIS — D649 Anemia, unspecified: Secondary | ICD-10-CM | POA: Diagnosis not present

## 2020-09-29 DIAGNOSIS — I10 Essential (primary) hypertension: Secondary | ICD-10-CM | POA: Diagnosis not present

## 2020-09-29 LAB — CBC
HCT: 27.4 % — ABNORMAL LOW (ref 36.0–46.0)
Hemoglobin: 9.1 g/dL — ABNORMAL LOW (ref 12.0–15.0)
MCH: 28.4 pg (ref 26.0–34.0)
MCHC: 33.2 g/dL (ref 30.0–36.0)
MCV: 85.6 fL (ref 80.0–100.0)
Platelets: 524 10*3/uL — ABNORMAL HIGH (ref 150–400)
RBC: 3.2 MIL/uL — ABNORMAL LOW (ref 3.87–5.11)
RDW: 14.3 % (ref 11.5–15.5)
WBC: 9.9 10*3/uL (ref 4.0–10.5)
nRBC: 0 % (ref 0.0–0.2)

## 2020-09-29 LAB — BLOOD CULTURE ID PANEL (REFLEXED) - BCID2

## 2020-09-29 LAB — PROTIME-INR
INR: 1.8 — ABNORMAL HIGH (ref 0.8–1.2)
Prothrombin Time: 19.8 seconds — ABNORMAL HIGH (ref 11.4–15.2)

## 2020-09-29 LAB — BASIC METABOLIC PANEL
Anion gap: 11 (ref 5–15)
BUN: 33 mg/dL — ABNORMAL HIGH (ref 8–23)
CO2: 21 mmol/L — ABNORMAL LOW (ref 22–32)
Calcium: 8.8 mg/dL — ABNORMAL LOW (ref 8.9–10.3)
Chloride: 107 mmol/L (ref 98–111)
Creatinine, Ser: 1.17 mg/dL — ABNORMAL HIGH (ref 0.44–1.00)
GFR, Estimated: 48 mL/min — ABNORMAL LOW (ref >60)
Glucose, Bld: 99 mg/dL (ref 70–99)
Potassium: 3.6 mmol/L (ref 3.5–5.1)
Sodium: 139 mmol/L (ref 135–145)

## 2020-09-29 LAB — CORTISOL-AM, BLOOD: Cortisol - AM: 5.6 ug/dL — ABNORMAL LOW (ref 6.7–22.6)

## 2020-09-29 LAB — PROCALCITONIN: Procalcitonin: 0.14 ng/mL

## 2020-09-29 MED ORDER — METOPROLOL SUCCINATE ER 25 MG PO TB24
12.5000 mg | ORAL_TABLET | Freq: Every day | ORAL | Status: DC
  Administered 2020-09-29 – 2020-09-30 (×2): 12.5 mg via ORAL
  Filled 2020-09-29 (×2): qty 0.5
  Filled 2020-09-29: qty 1

## 2020-09-29 MED ORDER — VANCOMYCIN HCL 750 MG/150ML IV SOLN: 750.0000 mg | INTRAVENOUS | Status: DC

## 2020-09-29 MED ORDER — MIDODRINE HCL 5 MG PO TABS
2.5000 mg | ORAL_TABLET | Freq: Three times a day (TID) | ORAL | Status: DC
  Administered 2020-09-29 – 2020-09-30 (×6): 2.5 mg via ORAL
  Filled 2020-09-29: qty 1
  Filled 2020-09-29 (×2): qty 0.5
  Filled 2020-09-29: qty 1
  Filled 2020-09-29 (×4): qty 0.5
  Filled 2020-09-29: qty 1

## 2020-09-29 MED ORDER — LACTATED RINGERS IV BOLUS
250.0000 mL | Freq: Once | INTRAVENOUS | Status: AC
  Administered 2020-09-29: 250 mL via INTRAVENOUS

## 2020-09-29 MED ORDER — LACTATED RINGERS IV SOLN: INTRAVENOUS | Status: AC

## 2020-09-29 NOTE — Evaluation (Signed)
Physical Therapy Evaluation Patient Details Name: Laurie Jackson MRN: [id number] DOB: May 10, 1941 Today's Date: 09/29/2020   History of Present Illness  80 y.o. female with medical history significant for Paroxysmal A. fib on Eliquis, Alzheimer's disease and total care dependent, hypertension, hx of lacunar infarct and osteoarthritis who presented to the emergency room on the advice of her PCP after she developed an area of skin breakdown on her buttock a week ago that has been rapidly progressing. Per daughter in the last few months pt has had even more signficicant decline with mobilit, using Hoyer lift instead of w/c with increasing difficulty managing her daily needs.  Clinical Impression  Pt confused t/o the session, but was able to show effort with cuing for LE exercises and to a lesser degree during mobility activities.  She does have b/l resting ankle inversion/DF as well as knee OA (R with genu valgus deformity).  Apparently family is still able to help her to the [facility] with brief standing each morning, but they have needed to use the [employer] more recently.  Pt is pleasant, but confused t/o most of the session but with repeated cuing (from PT and daughter) was able to show some activity/participate with exercises and mobility.  HR was very labile during eval, <100 on arrival 130-140 with minimal activity, in getting to sitting it read as high as 160s and 170s but relatively quickly dropped back down to the 110-130 range, sometimes below 100 but with any activity it seemed to quickly rise.  Pt is limited at baseline but has had a significant decline in recent months and management in the home has become more difficult, including new sacral ulcer that was the impetus for this hospitalization.    Follow Up Recommendations SNF;Supervision/Assistance - 24 hour    Equipment Recommendations   (TBD)    Recommendations for Other Services       Precautions / Restrictions  Precautions Precautions: Fall Restrictions Weight Bearing Restrictions: No      Mobility  Bed Mobility Overal bed mobility: Needs Assistance Bed Mobility: Supine to Sit;Sit to Supine     Supine to sit: Mod assist Sit to supine: Max assist   General bed mobility comments: with count down pt was able to show some minimal effort in assisting to sitting but was mostly dependent with the effort    Transfers Overall transfer level: Needs assistance Equipment used: Rolling walker (2 wheeled) Transfers: Sit to/from Stand Sit to Stand: Max assist         General transfer comment: Per daughter she and her father help pt to stand every morning and get to the [facility] but even this has been more and more difficult recently.  Pt anxious with standing attempt today, unable to keep hands on walker despite much cuing.  HR elevated reaching as high as 170s - deferred further standing activity  Ambulation/Gait             General Gait Details: deferred, pt unable to walk at baseline  Stairs            Wheelchair Mobility    Modified Rankin (Stroke Patients Only)       Balance                                             Pertinent Vitals/Pain Pain Assessment:  (unable to rate, indicates general arthritic  pain with most LE movement)    Home Living Family/patient expects to be discharged to:: Skilled nursing facility                      Prior Function Level of Independence: Needs assistance         Comments: Pt needs assist with all aspects of care     Hand Dominance        Extremity/Trunk Assessment   Upper Extremity Assessment Upper Extremity Assessment: Generalized weakness;Difficult to assess due to impaired cognition (able to give some AROM in trying to follow instruction for UE movement, inconsistent 2/2 mental status)    Lower Extremity Assessment Lower Extremity Assessment: Generalized weakness;Difficult to assess due to  impaired cognition (pt was able to give some AROM with cuing but inconsistent)       Communication   Communication: Expressive difficulties  Cognition Arousal/Alertness: Awake/alert Behavior During Therapy: Anxious Overall Cognitive Status: Within Functional Limits for tasks assessed                                        General Comments      Exercises     Assessment/Plan    PT Assessment Patient needs continued PT services  PT Problem List Decreased strength;Decreased range of motion;Decreased activity tolerance;Decreased balance;Decreased mobility;Decreased safety awareness;Decreased knowledge of use of DME;Pain;Decreased coordination;Decreased cognition       PT Treatment Interventions DME instruction;Functional mobility training;Therapeutic activities;Therapeutic exercise;Cognitive remediation;Patient/family education    PT Goals (Current goals can be found in the Care Plan section)  Acute Rehab PT Goals Patient Stated Goal: daughter hopes to be able to transition to rehab PT Goal Formulation: With patient Time For Goal Achievement: 10/13/20 Potential to Achieve Goals: Fair    Frequency Min 2X/week   Barriers to discharge        Co-evaluation               AM-PAC PT "6 Clicks" Mobility  Outcome Measure Help needed turning from your back to your side while in a flat bed without using bedrails?: A Lot Help needed moving from lying on your back to sitting on the side of a flat bed without using bedrails?: A Lot Help needed moving to and from a bed to a chair (including a wheelchair)?: Total Help needed standing up from a chair using your arms (e.g., wheelchair or bedside chair)?: A Lot Help needed to walk in hospital room?: Total Help needed climbing 3-5 steps with a railing? : Total 6 Click Score: 9    End of Session Equipment Utilized During Treatment: Gait belt Activity Tolerance: Patient limited by fatigue (mental status limits ability  to fully participate)     PT Visit Diagnosis: Muscle weakness (generalized) (M62.81);Unsteadiness on feet (R26.81)    Time: 5732-2025 PT Time Calculation (min) (ACUTE ONLY): 25 min   Charges:   PT Evaluation $PT Eval Low Complexity: 1 Low PT Treatments $Therapeutic Activity: 8-22 mins        Malachi Pro, DPT 09/29/2020, 9:25 AM

## 2020-09-29 NOTE — ED Notes (Addendum)
Pt repositioned to left side with pillow support. Pressure wound dressing placed on sacrum over pressure ulcer.

## 2020-09-29 NOTE — ED Notes (Signed)
In and out catheter performed by this RN with assistance from [facility]. 400 ml of urine output. Some urine in pt brief. Brief and chux pad removed. Pt cleansed using wipes. Clean brief and chux placed. External catheter placed and connected to suction. Pt repositioned in bed and covered with warm blankets. Denies further needs at this time.

## 2020-09-29 NOTE — Consult Note (Signed)
Pharmacy Antibiotic Note  Laurie Jackson is a 80 y.o. female admitted on 09/28/2020 for wound check   Pharmacy has been consulted for Vancomycin dosing for cellulitis. Patient does have elevated Scr -- unsure of baseline renal function. Most recent  creatinine from care everywhere on 09/20/20 was 1.0.   Of note: appears patient takes reduced dose apixaban for Afib  Plan: --Vancomycin 1g x1 in ED; followed by Vancomycin 750 mg q36h --Estimated AUC of 452 calculated with TBW and a Vd coefficient of 0.72 --SCr improved 2.15>1.17    Height: 5\' 7"  (170.2 cm) Weight: 49.9 kg (110 lb) IBW/kg (Calculated) : 61.6  Temp (24hrs), Avg:99.1 F (37.3 C), Min:98.4 F (36.9 C), Max:99.5 F (37.5 C)  Recent Labs  Lab 09/28/20 1811 09/28/20 1816 09/28/20 2040 09/29/20 0652  WBC 14.2*  --   --  9.9  CREATININE 2.15*  --   --  1.17*  LATICACIDVEN  --  1.6 2.7*  --     Estimated Creatinine Clearance: 31.7 mL/min (A) (by C-G formula based on SCr of 1.17 mg/dL (H)).    Allergies  Allergen Reactions  . Penicillin G Other (See Comments)    Drunk feeling/weak    Antimicrobials this admission: Vancomycin >> 1/18 > Ceftriaxone >> 1/18 > Levaquin  >> 1/18 x 1  Dose adjustments this admission: n/a  Microbiology results: BCx 1/18 >> NG1D  UCx 1/18 >> sent  Thank you for allowing pharmacy to be a part of this patient's care.  2/18, PharmD, BCPS Clinical Pharmacist  09/29/2020 12:26 PM

## 2020-09-29 NOTE — Progress Notes (Signed)
PROGRESS NOTE    Laurie Jackson  ZOX:[id number] DOB: 06/16/1941 DOA: 09/28/2020 PCP: Marguarite ArbourSparks, Jeffrey D, MD    Brief Narrative:  Laurie Jackson is a 80 y.o. female with medical history significant for Paroxysmal A. fib on Eliquis, Alzheimer's disease and total care dependent, hypertension, hx of lacunar infarct and osteoarthritis who presented to the emergency room on the advice of her PCP after she developed an area of skin breakdown on her buttock a week ago that has been rapidly progressing.  Most of the history is taken from patient's daughter, Laurie Jackson due to her mother's dementia.  She states that her mother has been rapidly declining over the past 2 months and currently she needs assistance for all activities of daily living including feeding, grooming and transfers.  Daughter has moved to live with her parents to assist her father and providing full-time care to her mother .  She states that over the past couple weeks her mother has  been very weak and has been spending most of the time in the bed. Physical therapy was ordered however patient was unable to participate with them.  Over the past week they noticed the area of skin blistering and was thus advised by the PCP to present to the emergency room.  Found with A. fib RVR  1/19- afib rvr today  Consultants:     Procedures:   Antimicrobials:       Subjective: Pt pleasant, has no complaints of sob, cp, dizzyness. SPL at bedside.   Objective: Vitals:   09/29/20 1432 09/29/20 1630 09/29/20 1700 09/29/20 1730  BP:  125/90 140/89 126/83  Pulse: (!) 127 (!) 101    Resp:  14 (!) 27 15  Temp:      TempSrc:      SpO2:  95%    Weight:      Height:        Intake/Output Summary (Last 24 hours) at 09/29/2020 1853 Last data filed at 09/29/2020 0127 Gross per 24 hour  Intake 1804.87 ml  Output -  Net 1804.87 ml   Filed Weights   09/28/20 1824  Weight: 49.9 kg    Examination:  General exam: Appears calm and  comfortable  Respiratory system: Clear to auscultation with poor respiratory effort Cardiovascular system: S1 & S2 heard, RRR. No JVD, murmurs, rubs, gallops or clicks.  Gastrointestinal system: Abdomen is nondistended, soft and nontender. Normal bowel sounds heard. Central nervous system: Alert and oriented to person and DOB only Extremities: no edema Psychiatry:  Mood & affect appropriate in current setting.     Data Reviewed: I have personally reviewed following labs and imaging studies  CBC: Recent Labs  Lab 09/28/20 1811 09/29/20 0652  WBC 14.2* 9.9  NEUTROABS 9.1*  --   HGB 10.6* 9.1*  HCT 32.4* 27.4*  MCV 86.4 85.6  PLT 629* 524*   Basic Metabolic Panel: Recent Labs  Lab 09/28/20 1811 09/29/20 0652  NA 143 139  K 4.0 3.6  CL 105 107  CO2 23 21*  GLUCOSE 115* 99  BUN 48* 33*  CREATININE 2.15* 1.17*  CALCIUM 9.7 8.8*   GFR: Estimated Creatinine Clearance: 31.7 mL/min (A) (by C-G formula based on SCr of 1.17 mg/dL (H)). Liver Function Tests: Recent Labs  Lab 09/28/20 1811  AST 17  ALT 17  ALKPHOS 43  BILITOT 0.9  PROT 7.2  ALBUMIN 3.0*   No results for input(s): LIPASE, AMYLASE in the last 168 hours. No results for input(s): AMMONIA in  the last 168 hours. Coagulation Profile: Recent Labs  Lab 09/28/20 1811 09/29/20 0652  INR 1.9* 1.8*   Cardiac Enzymes: No results for input(s): CKTOTAL, CKMB, CKMBINDEX, TROPONINI in the last 168 hours. BNP (last 3 results) No results for input(s): PROBNP in the last 8760 hours. HbA1C: No results for input(s): HGBA1C in the last 72 hours. CBG: No results for input(s): GLUCAP in the last 168 hours. Lipid Profile: No results for input(s): CHOL, HDL, LDLCALC, TRIG, CHOLHDL, LDLDIRECT in the last 72 hours. Thyroid Function Tests: No results for input(s): TSH, T4TOTAL, FREET4, T3FREE, THYROIDAB in the last 72 hours. Anemia Panel: No results for input(s): VITAMINB12, FOLATE, FERRITIN, TIBC, IRON, RETICCTPCT in the  last 72 hours. Sepsis Labs: Recent Labs  Lab 09/28/20 1816 09/28/20 2040 09/29/20 0652  PROCALCITON  --   --  0.14  LATICACIDVEN 1.6 2.7*  --     Recent Results (from the past 240 hour(s))  Resp Panel by RT-PCR (Flu A&B, Covid) Nasopharyngeal Swab     Status: None   Collection Time: 09/28/20  6:12 PM   Specimen: Nasopharyngeal Swab; Nasopharyngeal(NP) swabs in vial transport medium  Result Value Ref Range Status   SARS Coronavirus 2 by RT PCR NEGATIVE NEGATIVE Final    Comment: (NOTE) SARS-CoV-2 target nucleic acids are NOT DETECTED.  The SARS-CoV-2 RNA is generally detectable in upper respiratory specimens during the acute phase of infection. The lowest concentration of SARS-CoV-2 viral copies this assay can detect is 138 copies/mL. A negative result does not preclude SARS-Cov-2 infection and should not be used as the sole basis for treatment or other patient management decisions. A negative result may occur with  improper specimen collection/handling, submission of specimen other than nasopharyngeal swab, presence of viral mutation(s) within the areas targeted by this assay, and inadequate number of viral copies(<138 copies/mL). A negative result must be combined with clinical observations, patient history, and epidemiological information. The expected result is Negative.  Fact Sheet for Patients:  BloggerCourse.comhttps://www.fda.gov/media/152166/download  Fact Sheet for Healthcare Providers:  SeriousBroker.ithttps://www.fda.gov/media/152162/download  This test is no t yet approved or cleared by the [place] FDA and  has been authorized for detection and/or diagnosis of SARS-CoV-2 by FDA under an Emergency Use Authorization (EUA). This EUA will remain  in effect (meaning this test can be used) for the duration of the COVID-19 declaration under Section 564(b)(1) of the Act, 21 U.S.C.section 360bbb-3(b)(1), unless the authorization is terminated  or revoked sooner.       Influenza A by PCR  NEGATIVE NEGATIVE Final   Influenza B by PCR NEGATIVE NEGATIVE Final    Comment: (NOTE) The Xpert Xpress SARS-CoV-2/FLU/RSV plus assay is intended as an aid in the diagnosis of influenza from Nasopharyngeal swab specimens and should not be used as a sole basis for treatment. Nasal washings and aspirates are unacceptable for Xpert Xpress SARS-CoV-2/FLU/RSV testing.  Fact Sheet for Patients: BloggerCourse.comhttps://www.fda.gov/media/152166/download  Fact Sheet for Healthcare Providers: SeriousBroker.ithttps://www.fda.gov/media/152162/download  This test is not yet approved or cleared by the [place] FDA and has been authorized for detection and/or diagnosis of SARS-CoV-2 by FDA under an Emergency Use Authorization (EUA). This EUA will remain in effect (meaning this test can be used) for the duration of the COVID-19 declaration under Section 564(b)(1) of the Act, 21 U.S.C. section 360bbb-3(b)(1), unless the authorization is terminated or revoked.  Performed at [facility], [address] [place], [place] [address]   Blood culture (single)     Status: None (Preliminary result)  Collection Time: 09/28/20  6:12 PM   Specimen: BLOOD  Result Value Ref Range Status   Specimen Description BLOOD BLOOD RIGHT FOREARM  Final   Special Requests   Final    BOTTLES DRAWN AEROBIC AND ANAEROBIC Blood Culture adequate volume   Culture   Final    NO GROWTH < 24 HOURS Performed at [facility], [address] [place], [place] [address]    Report Status PENDING  Incomplete  Blood culture (routine single)     Status: None (Preliminary result)   Collection Time: 09/28/20  6:16 PM   Specimen: BLOOD  Result Value Ref Range Status   Specimen Description BLOOD BLOOD LEFT HAND  Final   Special Requests   Final    BOTTLES DRAWN AEROBIC AND ANAEROBIC Blood Culture results may not be optimal due to an inadequate volume of blood received in culture bottles   Culture  Setup Time   Final    Organism ID to  follow GRAM POSITIVE COCCI IN BOTH AEROBIC AND ANAEROBIC BOTTLES CRITICAL RESULT CALLED TO, READ BACK BY AND VERIFIED WITH: KISHAN PATEL ON 09/29/20 AT 1317 QSD Performed at [facility], [address] [place], [place] [address]    Culture GRAM POSITIVE COCCI  Final   Report Status PENDING  Incomplete  Blood Culture ID Panel (Reflexed)     Status: Abnormal   Collection Time: 09/28/20  6:16 PM  Result Value Ref Range Status   Enterococcus faecalis NOT DETECTED NOT DETECTED Final   Enterococcus Faecium NOT DETECTED NOT DETECTED Final   Listeria monocytogenes NOT DETECTED NOT DETECTED Final   Staphylococcus species DETECTED (A) NOT DETECTED Final    Comment: CRITICAL RESULT CALLED TO, READ BACK BY AND VERIFIED WITH: KISHAN PATEL ON 09/29/20 AT 1317 QSD    Staphylococcus aureus (BCID) NOT DETECTED NOT DETECTED Final   Staphylococcus epidermidis DETECTED (A) NOT DETECTED Final    Comment: CRITICAL RESULT CALLED TO, READ BACK BY AND VERIFIED WITH: KISHAN PATEL ON 09/29/20 AT 1317 QSD    Staphylococcus lugdunensis NOT DETECTED NOT DETECTED Final   Streptococcus species NOT DETECTED NOT DETECTED Final   Streptococcus agalactiae NOT DETECTED NOT DETECTED Final   Streptococcus pneumoniae NOT DETECTED NOT DETECTED Final   Streptococcus pyogenes NOT DETECTED NOT DETECTED Final   A.calcoaceticus-baumannii NOT DETECTED NOT DETECTED Final   Bacteroides fragilis NOT DETECTED NOT DETECTED Final   Enterobacterales NOT DETECTED NOT DETECTED Final   Enterobacter cloacae complex NOT DETECTED NOT DETECTED Final   Escherichia coli NOT DETECTED NOT DETECTED Final   Klebsiella aerogenes NOT DETECTED NOT DETECTED Final   Klebsiella oxytoca NOT DETECTED NOT DETECTED Final   Klebsiella pneumoniae NOT DETECTED NOT DETECTED Final   Proteus species NOT DETECTED NOT DETECTED Final   Salmonella species NOT DETECTED NOT DETECTED Final   Serratia marcescens NOT DETECTED NOT DETECTED Final   Haemophilus  influenzae NOT DETECTED NOT DETECTED Final   Neisseria meningitidis NOT DETECTED NOT DETECTED Final   Pseudomonas aeruginosa NOT DETECTED NOT DETECTED Final   Stenotrophomonas maltophilia NOT DETECTED NOT DETECTED Final   Candida albicans NOT DETECTED NOT DETECTED Final   Candida auris NOT DETECTED NOT DETECTED Final   Candida glabrata NOT DETECTED NOT DETECTED Final   Candida krusei NOT DETECTED NOT DETECTED Final   Candida parapsilosis NOT DETECTED NOT DETECTED Final   Candida tropicalis NOT DETECTED NOT DETECTED Final   Cryptococcus neoformans/gattii NOT DETECTED NOT DETECTED Final   Methicillin resistance mecA/C NOT DETECTED NOT  DETECTED Final    Comment: Performed at [facility], [address] [place], [place] [address]         Radiology Studies: DG Chest [place] 1 View  Result Date: 09/28/2020 CLINICAL DATA:  Pressure wounds EXAM: PORTABLE CHEST 1 VIEW COMPARISON:  07/23/2014 FINDINGS: The heart size and mediastinal contours are within normal limits. Both lungs are clear. The visualized skeletal structures are unremarkable. IMPRESSION: No active disease. Electronically Signed   By: Jasmine Pang M.D.   On: 09/28/2020 19:22        Scheduled Meds: . apixaban  2.5 mg Oral BID  . donepezil  10 mg Oral Daily  . metoprolol succinate  12.5 mg Oral Daily  . midodrine  2.5 mg Oral TID WC   Continuous Infusions: . cefTRIAXone (ROCEPHIN)  IV Stopped (09/29/20 0120)  . lactated ringers 150 mL/hr at 09/29/20 1835    Assessment & Plan:   Principal Problem:   Severe sepsis [facility]) Active Problems:   Benign essential hypertension   Chronic anemia   Mixed Alzheimer's and vascular dementia (HCC)   AKI (acute kidney injury) (HCC)   Sacral decubitus ulcer   Cellulitis   AF (paroxysmal atrial fibrillation) (HCC)   Chronic anticoagulation   Functional quadriplegia (HCC)   History of lacunar cerebrovascular accident   UTI (urinary tract infection)   80 year old female  with history of paroxysmal A. fib on Eliquis, Alzheimer's disease and total care dependent, hypertension, hx of lacunar infarct and osteoarthritis sent from PCP with concern for sacral skin breakdown associated with decreased oral intake and generalized weakness.      Severe sepsis (HCC)   Cellulitis/Blisters/ infected decubitus left buttock   UTI  - Patient  meeting sepsis criteria including leukocytosis, hypotension, tachycardia, AKI and tachypnea - Sepsis source, suspect cellulitis/infected decubitus ulcer as well as UTI Lactic acid elevated, will trend Continue IV fluids Continue IV antibiotics Wound care consulted 1 of 2 cultures staph epi-DC IV Vanco      AKI (acute kidney injury) (HCC) - Creatinine 2.15, up from 1.0 on 1/10 at doctor's office - Likely  related to combination of sepsis and decreased oral intake 1/19-creatinine improved to 1.17 today Continue IV fluids Avoid nephrotoxic medications     AF (paroxysmal atrial fibrillation) with RVR   Chronic anticoagulation - Ventricular response increase  likely secondary to sepsis/dehydration Rate was not controlled earlier we will add Toprol-XL 12.5 mg with parameters since BP better Added midodrine to low-dose for BP Continue Eliquis     Benign essential hypertension - Patient with soft blood pressures related to sepsis on arrival Antihypertensive held, BP a little bit better but will add low-dose midodrine as needed to add Toprol-XL for A. fib rate control    Chronic anemia - Hemoglobin 10.6 which is baseline    Mixed Alzheimer's and vascular dementia (HCC)   Functional quadriplegia (HCC) - According to daughter, patient is total care dependent and needs assistance in all ADLs - Dysphagia 2 diet and nutritionist eval - TOC consulted. Daughter would be interested in skilled rehab but states her father might be less interested - Fall and aspiration precautions and increase nursing assistance SPL saw patient  please see note    History of lacunar cerebrovascular accident -  On statin      DVT prophylaxis: Eliquis Code Status: DNR Family Communication: None at bedside  Status is: Inpatient  Remains inpatient appropriate because:Inpatient level of care appropriate due to severity of illness   Dispo: The  patient is from: Home              Anticipated d/c is to: TBD. daughter interested in skilled rehab              Anticipated d/c date is: 3 days              Patient currently is not medically stable to d/c.            LOS: 1 day   Time spent: 35 minutes with more than 50% on COC    Lynn Ito, MD [facility] Pager [phone]  If 7PM-7AM, please contact night-coverage 09/29/2020, 6:53 PM

## 2020-09-29 NOTE — ED Notes (Signed)
Still waiting for pharmacy to send pt's midodrine and metoprolol XL at this time.

## 2020-09-29 NOTE — ED Notes (Signed)
PT at bedside.

## 2020-09-29 NOTE — ED Notes (Signed)
Daughter at bedside. Pt given meal tray

## 2020-09-29 NOTE — Progress Notes (Signed)
PHARMACY - PHYSICIAN COMMUNICATION CRITICAL VALUE ALERT - BLOOD CULTURE IDENTIFICATION (BCID)  Laurie Jackson is an 80 y.o. female who presented to [facility] on 09/28/2020 with a chief complaint of worsening buttock wound.   Assessment:  1/18 blood cultures with GPC in 1 of 2 sets (both bottles of one set).  BCID detected MSSE.  Blood culture may be contaminant or from buttock wound  Name of physician (or Provider) Contacted: Dr Marylu Lund  Current antibiotics: vancomycin and ceftriaxone  Changes to prescribed antibiotics recommended:  Recommendations accepted by provider - stop vancomycin   Results for orders placed or performed during the hospital encounter of 09/28/20  Blood Culture ID Panel (Reflexed) (Collected: 09/28/2020  6:16 PM)  Result Value Ref Range   Enterococcus faecalis NOT DETECTED NOT DETECTED   Enterococcus Faecium NOT DETECTED NOT DETECTED   Listeria monocytogenes NOT DETECTED NOT DETECTED   Staphylococcus species DETECTED (A) NOT DETECTED   Staphylococcus aureus (BCID) NOT DETECTED NOT DETECTED   Staphylococcus epidermidis DETECTED (A) NOT DETECTED   Staphylococcus lugdunensis NOT DETECTED NOT DETECTED   Streptococcus species NOT DETECTED NOT DETECTED   Streptococcus agalactiae NOT DETECTED NOT DETECTED   Streptococcus pneumoniae NOT DETECTED NOT DETECTED   Streptococcus pyogenes NOT DETECTED NOT DETECTED   A.calcoaceticus-baumannii NOT DETECTED NOT DETECTED   Bacteroides fragilis NOT DETECTED NOT DETECTED   Enterobacterales NOT DETECTED NOT DETECTED   Enterobacter cloacae complex NOT DETECTED NOT DETECTED   Escherichia coli NOT DETECTED NOT DETECTED   Klebsiella aerogenes NOT DETECTED NOT DETECTED   Klebsiella oxytoca NOT DETECTED NOT DETECTED   Klebsiella pneumoniae NOT DETECTED NOT DETECTED   Proteus species NOT DETECTED NOT DETECTED   Salmonella species NOT DETECTED NOT DETECTED   Serratia marcescens NOT DETECTED NOT DETECTED   Haemophilus influenzae NOT  DETECTED NOT DETECTED   Neisseria meningitidis NOT DETECTED NOT DETECTED   Pseudomonas aeruginosa NOT DETECTED NOT DETECTED   Stenotrophomonas maltophilia NOT DETECTED NOT DETECTED   Candida albicans NOT DETECTED NOT DETECTED   Candida auris NOT DETECTED NOT DETECTED   Candida glabrata NOT DETECTED NOT DETECTED   Candida krusei NOT DETECTED NOT DETECTED   Candida parapsilosis NOT DETECTED NOT DETECTED   Candida tropicalis NOT DETECTED NOT DETECTED   Cryptococcus neoformans/gattii NOT DETECTED NOT DETECTED   Methicillin resistance mecA/C NOT DETECTED NOT DETECTED   [name], PharmD, BCPS.   Work Cell: [phone] 09/29/2020 2:01 PM

## 2020-09-29 NOTE — ED Notes (Signed)
Secure chat with Dr. Marylu Lund regarding pt most recent VS. 250cc bolus order obtained.

## 2020-09-29 NOTE — ED Notes (Signed)
MD messaged in regards to pt's HR still maintaining between 120-140s. No new orders at this time

## 2020-09-29 NOTE — ED Notes (Signed)
Pt given lunch and assisted to open it. Pt states she does not want any food right now.

## 2020-09-29 NOTE — Evaluation (Signed)
Clinical/Bedside Swallow Evaluation Patient Details  Name: Laurie Jackson MRN: [id number] Date of Birth: 14-Mar-1941  Today's Date: 09/29/2020 Time: SLP Start Time (ACUTE ONLY): 1120 SLP Stop Time (ACUTE ONLY): 1211 SLP Time Calculation (min) (ACUTE ONLY): 51.95 min  Past Medical History:  Past Medical History:  Diagnosis Date  . Dementia (HCC)   . HTN (hypertension)   . OA (osteoarthritis)   . Paroxysmal A-fib [facility])    Past Surgical History:  Past Surgical History:  Procedure Laterality Date  . ABDOMINAL HYSTERECTOMY     HPI:  Per admitting H&P "Laurie Jackson is a 80 y.o. female with medical history significant for Paroxysmal A. fib on Eliquis, Alzheimer's disease and total care dependent, hypertension, hx of lacunar infarct and osteoarthritis who presented to the emergency room on the advice of her PCP after she developed an area of skin breakdown on her buttock a week ago that has been rapidly progressing.  Most of the history is taken from patient's daughter, Libra Gatz due to her mother's dementia.  She states that her mother has been rapidly declining over the past 2 months and currently she needs assistance for all activities of daily living including feeding, grooming and transfers.  Daughter has moved to live with her parents to assist her father and providing full-time care to her mother .  She states that over the past couple weeks her mother has  been very weak and has been spending most of the time in the bed. Physical therapy was ordered however patient was unable to participate with them.  Over the past week they noticed the area of skin blistering and was thus advised by the PCP to present to the emergency room. "   Assessment / Plan / Recommendation Clinical Impression  Bedside swallow eval revealed moderate dysphagia with inability to eat solids foods safely. It is positive to note that no s/s of aspiration occurred with any tested consistency. Pt has no bottom  teeth or dentures present. Noted pocketing and oral residue with chopped lunch today eventually needing to spit out majority of boluses. Pt tolerated purees well along with several sips of thin liquid by straw. Rec diet downgrade to Dys 1 with thin liquids. Pt often stated she was full after a few bites but then requested something to eat after 10 more minutes. Rec frequent attempts to feed Pt to ensure adequate nutrition. Pt has Azheimers type dementia and may forget she has not eaten. Prognosis good.  St to follow up with toleration of diet 1-2 days. Please notify ST of any dysphagia with current Dysphagia 1 diet. Can try meds whole one at a time. If any difficulty, crush in applesauce SLP Visit Diagnosis: Dysphagia, oral phase (R13.11)    Aspiration Risk  Moderate aspiration risk    Diet Recommendation Dysphagia 1 (Puree)   Liquid Administration via: Straw Medication Administration: Other (Comment) (Try whole if any difficulty crush) Supervision: Full supervision/cueing for compensatory strategies;Comment (Frequent attempts to feed Pt) Compensations: Minimize environmental distractions;Slow rate;Small sips/bites Postural Changes: Seated upright at 90 degrees;Remain upright for at least 30 minutes after po intake    Other  Recommendations Oral Care Recommendations: Oral care BID   Follow up Recommendations Skilled Nursing facility      Frequency and Duration min 2x/week  1 week       Prognosis Barriers to Reach Goals: Cognitive deficits      Swallow Study   General Date of Onset: 09/28/20 HPI: Per admitting H&P "[name] is a  80 y.o. female with medical history significant for Paroxysmal A. fib on Eliquis, Alzheimer's disease and total care dependent, hypertension, hx of lacunar infarct and osteoarthritis who presented to the emergency room on the advice of her PCP after she developed an area of skin breakdown on her buttock a week ago that has been rapidly progressing.  Most  of the history is taken from patient's daughter, Laurie Jackson due to her mother's dementia.  She states that her mother has been rapidly declining over the past 2 months and currently she needs assistance for all activities of daily living including feeding, grooming and transfers.  Daughter has moved to live with her parents to assist her father and providing full-time care to her mother .  She states that over the past couple weeks her mother has  been very weak and has been spending most of the time in the bed. Physical therapy was ordered however patient was unable to participate with them.  Over the past week they noticed the area of skin blistering and was thus advised by the PCP to present to the emergency room. " Type of Study: Bedside Swallow Evaluation Diet Prior to this Study: Dysphagia 2 (chopped) Temperature Spikes Noted: No Respiratory Status: Room air History of Recent Intubation: No Behavior/Cognition: Alert;Cooperative;Pleasant mood;Requires cueing Oral Cavity Assessment: Within Functional Limits Oral Care Completed by SLP: No Oral Cavity - Dentition: Missing dentition (No bottom teeth) Vision: Impaired for self-feeding Self-Feeding Abilities: Needs set up;Total assist Patient Positioning: Upright in bed Baseline Vocal Quality: Normal    Oral/Motor/Sensory Function Overall Oral Motor/Sensory Function: Within functional limits   Ice Chips Ice chips: Not tested   Thin Liquid Thin Liquid: Within functional limits Presentation: Cup;Spoon;Straw    Nectar Thick Nectar Thick Liquid: Not tested   Honey Thick Honey Thick Liquid: Not tested   Puree Puree: Within functional limits Presentation: Spoon   Solid     Solid: Impaired Presentation: Spoon Oral Phase Impairments: Poor awareness of bolus;Impaired mastication Oral Phase Functional Implications: Right lateral sulci pocketing;Left lateral sulci pocketing;Prolonged oral transit;Impaired mastication;Oral residue       Eather Colas 09/29/2020,12:12 PM

## 2020-09-29 NOTE — ED Notes (Signed)
Pt bladder scanned, >470ml noted to be retained. MD Amery notified, awaiting further orders at this time.

## 2020-09-30 LAB — BASIC METABOLIC PANEL
Anion gap: 11 (ref 5–15)
BUN: 25 mg/dL — ABNORMAL HIGH (ref 8–23)
CO2: 22 mmol/L (ref 22–32)
Calcium: 8.8 mg/dL — ABNORMAL LOW (ref 8.9–10.3)
Chloride: 107 mmol/L (ref 98–111)
Creatinine, Ser: 0.88 mg/dL (ref 0.44–1.00)
GFR, Estimated: >60 mL/min (ref >60)
Glucose, Bld: 93 mg/dL (ref 70–99)
Potassium: 3.5 mmol/L (ref 3.5–5.1)
Sodium: 140 mmol/L (ref 135–145)

## 2020-09-30 LAB — LACTIC ACID, PLASMA
Lactic Acid, Venous: 1.2 mmol/L (ref 0.5–1.9)
Lactic Acid, Venous: 2.5 mmol/L (ref 0.5–1.9)

## 2020-09-30 MED ORDER — ENSURE ENLIVE PO LIQD
237.0000 mL | Freq: Two times a day (BID) | ORAL | Status: DC
  Administered 2020-09-30 – 2020-10-08 (×15): 237 mL via ORAL

## 2020-09-30 MED ORDER — METOPROLOL SUCCINATE ER 25 MG PO TB24
12.5000 mg | ORAL_TABLET | Freq: Two times a day (BID) | ORAL | Status: DC
  Administered 2020-09-30: 12.5 mg via ORAL
  Filled 2020-09-30 (×2): qty 1

## 2020-09-30 MED ORDER — OCUVITE-LUTEIN PO CAPS
1.0000 | ORAL_CAPSULE | Freq: Every day | ORAL | Status: DC
  Administered 2020-09-30 – 2020-10-07 (×8): 1 via ORAL
  Filled 2020-09-30 (×10): qty 1

## 2020-09-30 NOTE — Progress Notes (Signed)
Chart reviewed, Pt visited. Tolerating diet well per daughter who is very pleased with her moms progress. She ate almost all of breakfast and seems to be enjoying the pureed diet. Rec continue with Dys 1 diet with thin liquids at D/C

## 2020-09-30 NOTE — Progress Notes (Signed)
Initial Nutrition Assessment  DOCUMENTATION CODES:   Not applicable  INTERVENTION:  Ensure Enlive po BID, each supplement provides 350 kcal and 20 grams of protein (strawberry)  Ocuvite daily for wound healing (provides zinc, vitamin A, vitamin C, Vitamin E, copper, and selenium)   NUTRITION DIAGNOSIS:   Increased nutrient needs related to wound healing (stage II pressure injury to sacrum x 3 poa) as evidenced by estimated needs.    GOAL:   Patient will meet greater than or equal to 90% of their needs    MONITOR:   PO intake,Supplement acceptance,Weight trends,Labs,I & O's,Skin  REASON FOR ASSESSMENT:   Consult Assessment of nutrition requirement/status  ASSESSMENT:  80 year old female with history of Paroxysmal atrial fibrillation on Eliquis, Alzheimer's disease and total care dependent, HTN, hx of lacunar infarct, and osteoarthritis presented on the advise of PCP for rapid worsening of skin breakdown to buttocks over the last week. Patient admitted with severe sepsis secondary to UTI and infected decubitus ulcer.  Patient sitting up in bed, daughter at bedside feeding pt lunch this afternoon. Daughter reports pt consumed 100% of breakfast tray and appetite significantly improved. Daughter reports very poor po over the past week due to not feeling well, not eating/drinking much at all. She denies decreased weight, recalls pt usually weighs around 125 lbs. No recent wt history for review. RD discussed the importance of nutrition as well as increased protein needs to support wound healing. Pt agreeable to trying strawberry flavor Ensure to help her meet her needs. Will also order Ocuvite daily to support wound healing and will switch to Juven once sepsis physiology resolved.   Medications reviewed and include: Rocephin  Labs: BUN 25 (H)   NUTRITION - FOCUSED PHYSICAL EXAM: Unable to complete at this time, daughter at bedside feeding pt lunch   Diet Order:   Diet Order             DIET - DYS 1 Room service appropriate? Yes; Fluid consistency: Thin  Diet effective 1400                 EDUCATION NEEDS:   Education needs have been addressed  Skin:  Skin Assessment: Skin Integrity Issues: Skin Integrity Issues:: Stage II Stage II: sacrum x 3 areas  Last BM:  pta  Height:   Ht Readings from Last 1 Encounters:  09/29/20 5\' 7"  (1.702 m)    Weight:   Wt Readings from Last 1 Encounters:  09/29/20 56.3 kg    BMI:  Body mass index is 19.45 kg/m.  Estimated Nutritional Needs:   Kcal:  1600-1850  Protein:  84-96  Fluid:  >1.4 L    10/01/20, RD, LDN Clinical Nutrition After Hours/Weekend Pager # in Amion

## 2020-09-30 NOTE — Consult Note (Signed)
WOC Nurse Consult Note: Reason for Consult: Stage 3 pressure (and moisture contributing due to incontinence) injury to coccyx and bilateral upper buttocks.  Bilateral lateral heel unstageable pressure injuries. Patient with purewick in place. Skin is now clean and dry. Wears briefs at homes.  Skin exposed to dermatherapy therapeutic linen to wick moisture and improve skin microclimate.  Wound type:pressure injuries Pressure Injury POA: Yes Measurement: coccyx:  2 cm x 2 cm x0.2 cm  Left upper gluteal: 1 cm x 1.5 cm x 0.2 cm  Right upper gluteal:  0.3 cm x 0.5 cm x 0.2 cm  Right lateral: 2 cm x 2 cm eschar Left lateral: 1.5 cm x 1 cm eschar Wound bed: red and moist Drainage (amount, consistency, odor) minimal serosanguinous  Musty odor Periwound: peeling epithelium to eschar Dressing procedure/placement/frequency:Cleanse wounds to sacrum with soap and water and pat dry.Apply barrier cream to open areas to moisturize and minimize adherence.  Reapply cream every shift and change sacral dressing every three days.  Paint bilateral heel wounds with betadine daily.  Open to air.   Prevalon boots to bilateral heels.  Will not follow at this time.  Please re-consult if needed.  Maple Hudson MSN, RN, FNP-BC CWON Wound, Ostomy, Continence Nurse Pager [phone]

## 2020-09-30 NOTE — NC FL2 (Signed)
[place] MEDICAID FL2 LEVEL OF CARE SCREENING TOOL     IDENTIFICATION  Patient Name: Laurie Jackson Birthdate: October 04, 1940 Sex: female Admission Date (Current Location): 09/28/2020  [place] and [place] Number:  Chiropodist and Address:  [facility], [address] [place], [place] [address]      Provider Number: [id number]  Attending Physician Name and Address:  Lynn Ito, MD  Relative Name and Phone Number:  Daughter Annaliesa Blann [phone]    Current Level of Care: Hospital Recommended Level of Care: Skilled Nursing Facility Prior Approval Number:    Date Approved/Denied:   PASRR Number: [id number]  Discharge Plan: SNF    Current Diagnoses: Patient Active Problem List   Diagnosis Date Noted  . UTI (urinary tract infection) 09/29/2020  . Severe sepsis (HCC) 09/28/2020  . AKI (acute kidney injury) (HCC) 09/28/2020  . Sacral decubitus ulcer 09/28/2020  . Cellulitis 09/28/2020  . Atrial fibrillation, chronic (HCC) 09/28/2020  . AF (paroxysmal atrial fibrillation) (HCC) 09/28/2020  . Chronic anticoagulation 09/28/2020  . Functional quadriplegia (HCC) 09/28/2020  . History of lacunar cerebrovascular accident 09/28/2020  . Lacunar infarction (HCC) 12/03/2019  . Mixed Alzheimer's and vascular dementia (HCC) 12/03/2019  . Chronic anemia 12/19/2018  . Hand pain 05/29/2016  . Primary osteoarthritis of right knee 12/29/2014  . Benign essential hypertension 02/13/2014  . Pure hypercholesterolemia 02/13/2014    Orientation RESPIRATION BLADDER Height & Weight     Self,Place  Normal External catheter Weight: 56.3 kg Height:  5\' 7"  (170.2 cm)  BEHAVIORAL SYMPTOMS/MOOD NEUROLOGICAL BOWEL NUTRITION STATUS      Continent Diet  AMBULATORY STATUS COMMUNICATION OF NEEDS Skin   Extensive Assist Verbally Other (Comment)                       Personal Care Assistance Level of Assistance  Bathing,Feeding,Dressing Bathing  Assistance: Limited assistance Feeding assistance: Limited assistance Dressing Assistance: Limited assistance     Functional Limitations Info  Sight,Hearing,Speech Sight Info: Adequate Hearing Info: Adequate Speech Info: Adequate    SPECIAL CARE FACTORS FREQUENCY  PT (By licensed PT),OT (By licensed OT)     PT Frequency: 5x week OT Frequency: 5x week            Contractures Contractures Info: Not present    Additional Factors Info  Code Status,Allergies Code Status Info: DNR Allergies Info: Penicillin           Current Medications (09/30/2020):  This is the current hospital active medication list Current Facility-Administered Medications  Medication Dose Route Frequency Provider Last Rate Last Admin  . acetaminophen (TYLENOL) tablet 650 mg  650 mg Oral Q6H PRN 10/02/2020, MD       Or  . acetaminophen (TYLENOL) suppository 650 mg  650 mg Rectal Q6H PRN Andris Baumann, MD      . apixaban Andris Baumann) tablet 2.5 mg  2.5 mg Oral BID Everlene Balls, MD   2.5 mg at 09/30/20 0933  . cefTRIAXone (ROCEPHIN) 2 g in sodium chloride 0.9 % 100 mL IVPB  2 g Intravenous Q24H 10/02/20, MD   Stopped at 09/29/20 2129  . donepezil (ARICEPT) tablet 10 mg  10 mg Oral Daily 2130, MD   10 mg at 09/30/20 0934  . metoprolol succinate (TOPROL-XL) 24 hr tablet 12.5 mg  12.5 mg Oral Daily 10/02/20, MD   12.5 mg at 09/30/20 0933  . midodrine (PROAMATINE) tablet 2.5 mg  2.5  mg Oral TID WC Lynn Ito, MD   2.5 mg at 09/30/20 1243  . ondansetron (ZOFRAN) tablet 4 mg  4 mg Oral Q6H PRN Andris Baumann, MD       Or  . ondansetron [facility]) injection 4 mg  4 mg Intravenous Q6H PRN Andris Baumann, MD      . senna-docusate (Senokot-S) tablet 1 tablet  1 tablet Oral QHS PRN Andris Baumann, MD         Discharge Medications: Please see discharge summary for a list of discharge medications.  Relevant Imaging Results:  Relevant Lab Results:   Additional Information     Hetty Ely, RN

## 2020-09-30 NOTE — TOC Progression Note (Signed)
Transition of Care [facility]) - Progression Note    Patient Details  Name: Laurie Jackson MRN: [id number] Date of Birth: 05/05/1941  [facility] CM/SW Contact  Hetty Ely, RN Phone Number: 09/30/2020, 12:55 PM  Clinical Narrative:   Spoke with patient's daughter by phone, Chanda who voices concern about patient being discharged home, due to her working schedule and the age of her father, they will not be able to provide patient with the care needed. Daughter prefers that mother go to SNF, to receive the care needed. Daughter given website to view SNF's in the area and call with recommendations. In the meantime, I did advise daughter I would complete FL2 and start the process. Daughter agrees. Will continue to track and assist as needed.         Expected Discharge Plan and Services                                                 Social Determinants of Health (SDOH) Interventions    Readmission Risk Interventions No flowsheet data found.

## 2020-09-30 NOTE — Progress Notes (Signed)
Pt hasn't voided since she got up to her room around midnight. Bladder scan at 0515 showed 147. Will continue to monitor.

## 2020-09-30 NOTE — Progress Notes (Addendum)
PROGRESS NOTE    Laurie Jackson  ZOX:[id number] DOB: 07/18/1941 DOA: 09/28/2020 PCP: Marguarite ArbourSparks, Jeffrey D, MD    Brief Narrative:  Laurie ReeCarolyn Ledvina is a 80 y.o. female with medical history significant for Paroxysmal A. fib on Eliquis, Alzheimer's disease and total care dependent, hypertension, hx of lacunar infarct and osteoarthritis who presented to the emergency room on the advice of her PCP after she developed an area of skin breakdown on her buttock a week ago that has been rapidly progressing.  Most of the history is taken from patient's daughter, Laurie Jackson due to her mother's dementia.  She states that her mother has been rapidly declining over the past 2 months and currently she needs assistance for all activities of daily living including feeding, grooming and transfers.  Daughter has moved to live with her parents to assist her father and providing full-time care to her mother .  She states that over the past couple weeks her mother has  been very weak and has been spending most of the time in the bed. Physical therapy was ordered however patient was unable to participate with them.  Over the past week they noticed the area of skin blistering and was thus advised by the PCP to present to the emergency room.  Found with A. fib RVR  1/19- afib rvr today  1/20-ms better today.  Hr improving  Consultants:     Procedures:   Antimicrobials:    ceftriaxone  Subjective: Pt more interactive today. Denies sob, cp, chills.   Objective: Vitals:   09/29/20 2130 09/29/20 2255 09/29/20 2306 09/30/20 0615  BP: 134/78 130/89  (!) 122/92  Pulse: 84 (!) 101  (!) 105  Resp: 18 19  20   Temp:  (!) 97.1 F (36.2 C)  98.5 F (36.9 C)  TempSrc:    Oral  SpO2: 100% 100%  100%  Weight:   56.3 kg   Height:   5\' 7"  (1.702 m)     Intake/Output Summary (Last 24 hours) at 09/30/2020 0825 Last data filed at 09/30/2020 0617 Gross per 24 hour  Intake --  Output 0 ml  Net 0 ml   Filed Weights    09/28/20 1824 09/29/20 2306  Weight: 49.9 kg 56.3 kg    Examination: Calm, pleasant cta no r/w/r irreg-reg, s1/s2 no gallop Soft benign, +bs No edema b/l heel wounds has dressing on it. Aaxox3,knows dob but not present year. Grossly intact Mood and affect improving today      Data Reviewed: I have personally reviewed following labs and imaging studies  CBC: Recent Labs  Lab 09/28/20 1811 09/29/20 0652  WBC 14.2* 9.9  NEUTROABS 9.1*  --   HGB 10.6* 9.1*  HCT 32.4* 27.4*  MCV 86.4 85.6  PLT 629* 524*   Basic Metabolic Panel: Recent Labs  Lab 09/28/20 1811 09/29/20 0652 09/30/20 0607  NA 143 139 140  K 4.0 3.6 3.5  CL 105 107 107  CO2 23 21* 22  GLUCOSE 115* 99 93  BUN 48* 33* 25*  CREATININE 2.15* 1.17* 0.88  CALCIUM 9.7 8.8* 8.8*   GFR: Estimated Creatinine Clearance: 47.6 mL/min (by C-G formula based on SCr of 0.88 mg/dL). Liver Function Tests: Recent Labs  Lab 09/28/20 1811  AST 17  ALT 17  ALKPHOS 43  BILITOT 0.9  PROT 7.2  ALBUMIN 3.0*   No results for input(s): LIPASE, AMYLASE in the last 168 hours. No results for input(s): AMMONIA in the last 168 hours. Coagulation Profile: Recent  Labs  Lab 09/28/20 1811 09/29/20 0652  INR 1.9* 1.8*   Cardiac Enzymes: No results for input(s): CKTOTAL, CKMB, CKMBINDEX, TROPONINI in the last 168 hours. BNP (last 3 results) No results for input(s): PROBNP in the last 8760 hours. HbA1C: No results for input(s): HGBA1C in the last 72 hours. CBG: No results for input(s): GLUCAP in the last 168 hours. Lipid Profile: No results for input(s): CHOL, HDL, LDLCALC, TRIG, CHOLHDL, LDLDIRECT in the last 72 hours. Thyroid Function Tests: No results for input(s): TSH, T4TOTAL, FREET4, T3FREE, THYROIDAB in the last 72 hours. Anemia Panel: No results for input(s): VITAMINB12, FOLATE, FERRITIN, TIBC, IRON, RETICCTPCT in the last 72 hours. Sepsis Labs: Recent Labs  Lab 09/28/20 1816 09/28/20 2040  09/29/20 0652  PROCALCITON  --   --  0.14  LATICACIDVEN 1.6 2.7*  --     Recent Results (from the past 240 hour(s))  Resp Panel by RT-PCR (Flu A&B, Covid) Nasopharyngeal Swab     Status: None   Collection Time: 09/28/20  6:12 PM   Specimen: Nasopharyngeal Swab; Nasopharyngeal(NP) swabs in vial transport medium  Result Value Ref Range Status   SARS Coronavirus 2 by RT PCR NEGATIVE NEGATIVE Final    Comment: (NOTE) SARS-CoV-2 target nucleic acids are NOT DETECTED.  The SARS-CoV-2 RNA is generally detectable in upper respiratory specimens during the acute phase of infection. The lowest concentration of SARS-CoV-2 viral copies this assay can detect is 138 copies/mL. A negative result does not preclude SARS-Cov-2 infection and should not be used as the sole basis for treatment or other patient management decisions. A negative result may occur with  improper specimen collection/handling, submission of specimen other than nasopharyngeal swab, presence of viral mutation(s) within the areas targeted by this assay, and inadequate number of viral copies(<138 copies/mL). A negative result must be combined with clinical observations, patient history, and epidemiological information. The expected result is Negative.  Fact Sheet for Patients:  BloggerCourse.com  Fact Sheet for Healthcare Providers:  SeriousBroker.it  This test is no t yet approved or cleared by the [place] FDA and  has been authorized for detection and/or diagnosis of SARS-CoV-2 by FDA under an Emergency Use Authorization (EUA). This EUA will remain  in effect (meaning this test can be used) for the duration of the COVID-19 declaration under Section 564(b)(1) of the Act, 21 U.S.C.section 360bbb-3(b)(1), unless the authorization is terminated  or revoked sooner.       Influenza A by PCR NEGATIVE NEGATIVE Final   Influenza B by PCR NEGATIVE NEGATIVE Final    Comment:  (NOTE) The Xpert Xpress SARS-CoV-2/FLU/RSV plus assay is intended as an aid in the diagnosis of influenza from Nasopharyngeal swab specimens and should not be used as a sole basis for treatment. Nasal washings and aspirates are unacceptable for Xpert Xpress SARS-CoV-2/FLU/RSV testing.  Fact Sheet for Patients: BloggerCourse.com  Fact Sheet for Healthcare Providers: SeriousBroker.it  This test is not yet approved or cleared by the [place] FDA and has been authorized for detection and/or diagnosis of SARS-CoV-2 by FDA under an Emergency Use Authorization (EUA). This EUA will remain in effect (meaning this test can be used) for the duration of the COVID-19 declaration under Section 564(b)(1) of the Act, 21 U.S.C. section 360bbb-3(b)(1), unless the authorization is terminated or revoked.  Performed at [facility], [address] [place], [place] [address]   Blood culture (single)     Status: None (Preliminary result)   Collection Time: 09/28/20  6:12 PM  Specimen: BLOOD  Result Value Ref Range Status   Specimen Description BLOOD BLOOD RIGHT FOREARM  Final   Special Requests   Final    BOTTLES DRAWN AEROBIC AND ANAEROBIC Blood Culture adequate volume   Culture   Final    NO GROWTH 2 DAYS Performed at [facility], [address] [place], [place] [address]    Report Status PENDING  Incomplete  Blood culture (routine single)     Status: None (Preliminary result)   Collection Time: 09/28/20  6:16 PM   Specimen: BLOOD  Result Value Ref Range Status   Specimen Description BLOOD BLOOD LEFT HAND  Final   Special Requests   Final    BOTTLES DRAWN AEROBIC AND ANAEROBIC Blood Culture results may not be optimal due to an inadequate volume of blood received in culture bottles   Culture  Setup Time   Final    Organism ID to follow GRAM POSITIVE COCCI IN BOTH AEROBIC AND ANAEROBIC BOTTLES CRITICAL RESULT CALLED  TO, READ BACK BY AND VERIFIED WITH: Kevin Fenton PATEL ON 09/29/20 AT 1317 QSD Performed at [facility], [address] [place], [place] [address]    Culture GRAM POSITIVE COCCI  Final   Report Status PENDING  Incomplete  Urine culture     Status: None (Preliminary result)   Collection Time: 09/28/20  6:16 PM   Specimen: Urine, Random  Result Value Ref Range Status   Specimen Description   Final    URINE, RANDOM Performed at [facility], [address] [place], [place] [address]    Special Requests   Final    NONE Performed at [facility], [address], [place], [place] [address]    Culture   Final    CULTURE REINCUBATED FOR BETTER GROWTH Performed at [facility], [address] [place], [place] [address]    Report Status PENDING  Incomplete  Blood Culture ID Panel (Reflexed)     Status: Abnormal   Collection Time: 09/28/20  6:16 PM  Result Value Ref Range Status   Enterococcus faecalis NOT DETECTED NOT DETECTED Final   Enterococcus Faecium NOT DETECTED NOT DETECTED Final   Listeria monocytogenes NOT DETECTED NOT DETECTED Final   Staphylococcus species DETECTED (A) NOT DETECTED Final    Comment: CRITICAL RESULT CALLED TO, READ BACK BY AND VERIFIED WITH: KISHAN PATEL ON 09/29/20 AT 1317 QSD    Staphylococcus aureus (BCID) NOT DETECTED NOT DETECTED Final   Staphylococcus epidermidis DETECTED (A) NOT DETECTED Final    Comment: CRITICAL RESULT CALLED TO, READ BACK BY AND VERIFIED WITH: KISHAN PATEL ON 09/29/20 AT 1317 QSD    Staphylococcus lugdunensis NOT DETECTED NOT DETECTED Final   Streptococcus species NOT DETECTED NOT DETECTED Final   Streptococcus agalactiae NOT DETECTED NOT DETECTED Final   Streptococcus pneumoniae NOT DETECTED NOT DETECTED Final   Streptococcus pyogenes NOT DETECTED NOT DETECTED Final   A.calcoaceticus-baumannii NOT DETECTED NOT DETECTED Final   Bacteroides fragilis NOT DETECTED NOT DETECTED Final   Enterobacterales  NOT DETECTED NOT DETECTED Final   Enterobacter cloacae complex NOT DETECTED NOT DETECTED Final   Escherichia coli NOT DETECTED NOT DETECTED Final   Klebsiella aerogenes NOT DETECTED NOT DETECTED Final   Klebsiella oxytoca NOT DETECTED NOT DETECTED Final   Klebsiella pneumoniae NOT DETECTED NOT DETECTED Final   Proteus species NOT DETECTED NOT DETECTED Final   Salmonella species NOT DETECTED NOT DETECTED Final   Serratia marcescens NOT DETECTED NOT DETECTED Final  Haemophilus influenzae NOT DETECTED NOT DETECTED Final   Neisseria meningitidis NOT DETECTED NOT DETECTED Final   Pseudomonas aeruginosa NOT DETECTED NOT DETECTED Final   Stenotrophomonas maltophilia NOT DETECTED NOT DETECTED Final   Candida albicans NOT DETECTED NOT DETECTED Final   Candida auris NOT DETECTED NOT DETECTED Final   Candida glabrata NOT DETECTED NOT DETECTED Final   Candida krusei NOT DETECTED NOT DETECTED Final   Candida parapsilosis NOT DETECTED NOT DETECTED Final   Candida tropicalis NOT DETECTED NOT DETECTED Final   Cryptococcus neoformans/gattii NOT DETECTED NOT DETECTED Final   Methicillin resistance mecA/C NOT DETECTED NOT DETECTED Final    Comment: Performed at [facility], [address] [place], [place] [address]         Radiology Studies: DG Chest Port 1 View  Result Date: 09/28/2020 CLINICAL DATA:  Pressure wounds EXAM: PORTABLE CHEST 1 VIEW COMPARISON:  07/23/2014 FINDINGS: The heart size and mediastinal contours are within normal limits. Both lungs are clear. The visualized skeletal structures are unremarkable. IMPRESSION: No active disease. Electronically Signed   By: Jasmine PangKim  Fujinaga M.D.   On: 09/28/2020 19:22        Scheduled Meds: . apixaban  2.5 mg Oral BID  . donepezil  10 mg Oral Daily  . metoprolol succinate  12.5 mg Oral Daily  . midodrine  2.5 mg Oral TID WC   Continuous Infusions: . cefTRIAXone (ROCEPHIN)  IV Stopped (09/29/20 2129)    Assessment & Plan:    Principal Problem:   Severe sepsis (HCC) Active Problems:   Benign essential hypertension   Chronic anemia   Mixed Alzheimer's and vascular dementia (HCC)   AKI (acute kidney injury) (HCC)   Sacral decubitus ulcer   Cellulitis   AF (paroxysmal atrial fibrillation) (HCC)   Chronic anticoagulation   Functional quadriplegia (HCC)   History of lacunar cerebrovascular accident   UTI (urinary tract infection)   80 year old female with history of paroxysmal A. fib on Eliquis, Alzheimer's disease and total care dependent, hypertension, hx of lacunar infarct and osteoarthritis sent from PCP with concern for sacral skin breakdown associated with decreased oral intake and generalized weakness.      Severe sepsis (HCC)   Cellulitis/Blisters/ infected decubitus left buttock   UTI  - Patient  meeting sepsis criteria including leukocytosis, hypotension, tachycardia, AKI and tachypnea - Sepsis source, suspect cellulitis/infected decubitus ulcer as well as UTI Afebrile  1/20-Lactic acid down, then up again.  Will ck cbc Ucx + , awaiting final results 1/2 bcx staph epi-iv vanco was d/c'd bp improved with ivf and midrodrine Please see wound care nsg note Continue iv abx  repeat bcx since possible contaminant      AKI (acute kidney injury) (HCC) - Creatinine 2.15, up from 1.0 on 1/10 at doctor's office - Likely  related to combination of sepsis and decreased oral intake 1/20-improved with IV fluids  Avoid nephrotoxic medication  Fluid was discontinued Continue to monitor levels      AF (paroxysmal atrial fibrillation) with RVR   Chronic anticoagulation - Ventricular response increase  likely secondary to sepsis/dehydration Improving. We will increase Toprol-XL to 12.5 mg twice daily with parameters Continue midodrine for low BP which was started yesterday Continue Eliquis      Benign essential hypertension - Patient with soft blood pressures related to sepsis on  arrival Antihypertensive held initially Midodrine was added yesterday and once BP was improved Toprol-XL was added, see above     Chronic anemia - Hemoglobin 9.1  which is baseline To monitor    Mixed Alzheimer's and vascular dementia (HCC)   Functional quadriplegia (HCC) - According to daughter, patient is total care dependent and needs assistance in all ADLs - Dysphagia 2 diet and nutritionist eval - TOC consulted. Daughter would be interested in skilled rehab but states her father might be less interested - Fall and aspiration precautions and increase nursing assistance SPL saw patient please see note    History of lacunar cerebrovascular accident -  On statin      DVT prophylaxis: Eliquis Code Status: DNR Family Communication: Left voicemail for daughter, could not reach spouse either  Status is: Inpatient  Remains inpatient appropriate because:Inpatient level of care appropriate due to severity of illness   Dispo: The patient is from: Home              Anticipated d/c is to: TBD. daughter interested in skilled rehab              Anticipated d/c date is: 3 days              Patient currently is not medically stable to d/c. Heart rate still elevated, needs sensitivity of urine culture, continue IV antibiotics            LOS: 2 days   Time spent: 35 minutes with more than 50% on COC    Lynn Ito, MD [facility] Pager [phone]  If 7PM-7AM, please contact night-coverage 09/30/2020, 8:25 AM

## 2020-10-01 LAB — BASIC METABOLIC PANEL
Anion gap: 13 (ref 5–15)
BUN: 23 mg/dL (ref 8–23)
CO2: 19 mmol/L — ABNORMAL LOW (ref 22–32)
Calcium: 8.8 mg/dL — ABNORMAL LOW (ref 8.9–10.3)
Chloride: 107 mmol/L (ref 98–111)
Creatinine, Ser: 0.87 mg/dL (ref 0.44–1.00)
GFR, Estimated: >60 mL/min (ref >60)
Glucose, Bld: 93 mg/dL (ref 70–99)
Potassium: 3.3 mmol/L — ABNORMAL LOW (ref 3.5–5.1)
Sodium: 139 mmol/L (ref 135–145)

## 2020-10-01 LAB — MAGNESIUM: Magnesium: 1.6 mg/dL — ABNORMAL LOW (ref 1.7–2.4)

## 2020-10-01 LAB — CBC
HCT: 26.7 % — ABNORMAL LOW (ref 36.0–46.0)
Hemoglobin: 8.7 g/dL — ABNORMAL LOW (ref 12.0–15.0)
MCH: 28 pg (ref 26.0–34.0)
MCHC: 32.6 g/dL (ref 30.0–36.0)
MCV: 85.9 fL (ref 80.0–100.0)
Platelets: 518 10*3/uL — ABNORMAL HIGH (ref 150–400)
RBC: 3.11 MIL/uL — ABNORMAL LOW (ref 3.87–5.11)
RDW: 14.2 % (ref 11.5–15.5)
WBC: 8.7 10*3/uL (ref 4.0–10.5)
nRBC: 0 % (ref 0.0–0.2)

## 2020-10-01 LAB — CULTURE, BLOOD (SINGLE)

## 2020-10-01 LAB — LACTIC ACID, PLASMA
Lactic Acid, Venous: 1.3 mmol/L (ref 0.5–1.9)
Lactic Acid, Venous: 3.6 mmol/L (ref 0.5–1.9)

## 2020-10-01 LAB — OCCULT BLOOD X 1 CARD TO LAB, STOOL: Fecal Occult Bld: NEGATIVE

## 2020-10-01 MED ORDER — POTASSIUM CHLORIDE CRYS ER 20 MEQ PO TBCR
40.0000 meq | EXTENDED_RELEASE_TABLET | Freq: Once | ORAL | Status: AC
  Administered 2020-10-01: 40 meq via ORAL
  Filled 2020-10-01: qty 2

## 2020-10-01 MED ORDER — MAGNESIUM SULFATE 2 GM/50ML IV SOLN
2.0000 g | Freq: Once | INTRAVENOUS | Status: AC
  Administered 2020-10-01: 2 g via INTRAVENOUS
  Filled 2020-10-01: qty 50

## 2020-10-01 MED ORDER — MIDODRINE HCL 5 MG PO TABS
5.0000 mg | ORAL_TABLET | Freq: Three times a day (TID) | ORAL | Status: DC
  Administered 2020-10-01 – 2020-10-06 (×8): 5 mg via ORAL
  Filled 2020-10-01 (×11): qty 1

## 2020-10-01 MED ORDER — METOPROLOL SUCCINATE ER 25 MG PO TB24
12.5000 mg | ORAL_TABLET | Freq: Two times a day (BID) | ORAL | Status: DC
  Administered 2020-10-01 – 2020-10-02 (×3): 12.5 mg via ORAL
  Filled 2020-10-01 (×2): qty 1

## 2020-10-01 MED ORDER — SODIUM CHLORIDE 0.9 % IV SOLN: INTRAVENOUS | Status: DC

## 2020-10-01 NOTE — Progress Notes (Signed)
PROGRESS NOTE    Laurie Jackson  OFP:[id number] DOB: [birth year] DOA: 09/28/2020 PCP: Marguarite Arbour, MD    Brief Narrative:  Laurie Jackson is a 80 y.o. female with medical history significant for Paroxysmal A. fib on Eliquis, Alzheimer's disease and total care dependent, hypertension, hx of lacunar infarct and osteoarthritis who presented to the emergency room on the advice of her PCP after she developed an area of skin breakdown on her buttock a week ago that has been rapidly progressing.  Most of the history is taken from patient's daughter, Shalona Harbour due to her mother's dementia.  She states that her mother has been rapidly declining over the past 2 months and currently she needs assistance for all activities of daily living including feeding, grooming and transfers.  Daughter has moved to live with her parents to assist her father and providing full-time care to her mother .  She states that over the past couple weeks her mother has  been very weak and has been spending most of the time in the bed. Physical therapy was ordered however patient was unable to participate with them.  Over the past week they noticed the area of skin blistering and was thus advised by the PCP to present to the emergency room.  Found with A. fib RVR  1/19- afib rvr today  1/20-ms better today.  Hr improving  1/21-spoke to daughter, states moms MS much improved 85%.  HR better controlled.   Consultants:     Procedures:   Antimicrobials:    ceftriaxone  Subjective: Pt without complaints of sob, cp. On RA  Objective: Vitals:   09/30/20 2042 10/01/20 0358 10/01/20 0743 10/01/20 1147  BP: (!) 129/93 118/81 109/78 (!) 142/95  Pulse: 100 97 80 (!) 109  Resp: 17 18 17 16   Temp: 99.1 F (37.3 C) 97.9 F (36.6 C) (!) 97.5 F (36.4 C) 97.9 F (36.6 C)  TempSrc: Oral Oral Oral   SpO2: 100% 100% 100% 98%  Weight:  57.4 kg    Height:        Intake/Output Summary (Last 24 hours) at 10/01/2020  1529 Last data filed at 10/01/2020 0900 Gross per 24 hour  Intake 320 ml  Output 325 ml  Net -5 ml   Filed Weights   09/28/20 1824 09/29/20 2306 10/01/20 0358  Weight: 49.9 kg 56.3 kg 57.4 kg    Examination: Calm, more interactive, pleasant CTA, no wheeze rales rhonchi Regular s1/s2 no gallop Soft benign +bs No edema  Awake , alert, grossly intact Mood and affect appropriate in current setting      Data Reviewed: I have personally reviewed following labs and imaging studies  CBC: Recent Labs  Lab 09/28/20 1811 09/29/20 0652 10/01/20 0409  WBC 14.2* 9.9 8.7  NEUTROABS 9.1*  --   --   HGB 10.6* 9.1* 8.7*  HCT 32.4* 27.4* 26.7*  MCV 86.4 85.6 85.9  PLT 629* 524* 518*   Basic Metabolic Panel: Recent Labs  Lab 09/28/20 1811 09/29/20 0652 09/30/20 0607 10/01/20 0409  NA 143 139 140 139  K 4.0 3.6 3.5 3.3*  CL 105 107 107 107  CO2 23 21* 22 19*  GLUCOSE 115* 99 93 93  BUN 48* 33* 25* 23  CREATININE 2.15* 1.17* 0.88 0.87  CALCIUM 9.7 8.8* 8.8* 8.8*  MG  --   --   --  1.6*   GFR: Estimated Creatinine Clearance: 49.1 mL/min (by C-G formula based on SCr of 0.87 mg/dL). Liver Function  Tests: Recent Labs  Lab 09/28/20 1811  AST 17  ALT 17  ALKPHOS 43  BILITOT 0.9  PROT 7.2  ALBUMIN 3.0*   No results for input(s): LIPASE, AMYLASE in the last 168 hours. No results for input(s): AMMONIA in the last 168 hours. Coagulation Profile: Recent Labs  Lab 09/28/20 1811 09/29/20 0652  INR 1.9* 1.8*   Cardiac Enzymes: No results for input(s): CKTOTAL, CKMB, CKMBINDEX, TROPONINI in the last 168 hours. BNP (last 3 results) No results for input(s): PROBNP in the last 8760 hours. HbA1C: No results for input(s): HGBA1C in the last 72 hours. CBG: No results for input(s): GLUCAP in the last 168 hours. Lipid Profile: No results for input(s): CHOL, HDL, LDLCALC, TRIG, CHOLHDL, LDLDIRECT in the last 72 hours. Thyroid Function Tests: No results for input(s): TSH,  T4TOTAL, FREET4, T3FREE, THYROIDAB in the last 72 hours. Anemia Panel: No results for input(s): VITAMINB12, FOLATE, FERRITIN, TIBC, IRON, RETICCTPCT in the last 72 hours. Sepsis Labs: Recent Labs  Lab 09/29/20 4680 09/30/20 0923 09/30/20 1212 10/01/20 0917 10/01/20 1150  PROCALCITON 0.14  --   --   --   --   LATICACIDVEN  --  1.2 2.5* 1.3 3.6*    Recent Results (from the past 240 hour(s))  Resp Panel by RT-PCR (Flu A&B, Covid) Nasopharyngeal Swab     Status: None   Collection Time: 09/28/20  6:12 PM   Specimen: Nasopharyngeal Swab; Nasopharyngeal(NP) swabs in vial transport medium  Result Value Ref Range Status   SARS Coronavirus 2 by RT PCR NEGATIVE NEGATIVE Final    Comment: (NOTE) SARS-CoV-2 target nucleic acids are NOT DETECTED.  The SARS-CoV-2 RNA is generally detectable in upper respiratory specimens during the acute phase of infection. The lowest concentration of SARS-CoV-2 viral copies this assay can detect is 138 copies/mL. A negative result does not preclude SARS-Cov-2 infection and should not be used as the sole basis for treatment or other patient management decisions. A negative result may occur with  improper specimen collection/handling, submission of specimen other than nasopharyngeal swab, presence of viral mutation(s) within the areas targeted by this assay, and inadequate number of viral copies(<138 copies/mL). A negative result must be combined with clinical observations, patient history, and epidemiological information. The expected result is Negative.  Fact Sheet for Patients:  BloggerCourse.com  Fact Sheet for Healthcare Providers:  SeriousBroker.it  This test is no t yet approved or cleared by the [place] FDA and  has been authorized for detection and/or diagnosis of SARS-CoV-2 by FDA under an Emergency Use Authorization (EUA). This EUA will remain  in effect (meaning this test can be used)  for the duration of the COVID-19 declaration under Section 564(b)(1) of the Act, 21 U.S.C.section 360bbb-3(b)(1), unless the authorization is terminated  or revoked sooner.       Influenza A by PCR NEGATIVE NEGATIVE Final   Influenza B by PCR NEGATIVE NEGATIVE Final    Comment: (NOTE) The Xpert Xpress SARS-CoV-2/FLU/RSV plus assay is intended as an aid in the diagnosis of influenza from Nasopharyngeal swab specimens and should not be used as a sole basis for treatment. Nasal washings and aspirates are unacceptable for Xpert Xpress SARS-CoV-2/FLU/RSV testing.  Fact Sheet for Patients: BloggerCourse.com  Fact Sheet for Healthcare Providers: SeriousBroker.it  This test is not yet approved or cleared by the [place] FDA and has been authorized for detection and/or diagnosis of SARS-CoV-2 by FDA under an Emergency Use Authorization (EUA). This EUA will remain in effect (meaning this  test can be used) for the duration of the COVID-19 declaration under Section 564(b)(1) of the Act, 21 U.S.C. section 360bbb-3(b)(1), unless the authorization is terminated or revoked.  Performed at [facility], [address] [place], [place] [address]   Blood culture (single)     Status: None (Preliminary result)   Collection Time: 09/28/20  6:12 PM   Specimen: BLOOD  Result Value Ref Range Status   Specimen Description BLOOD BLOOD RIGHT FOREARM  Final   Special Requests   Final    BOTTLES DRAWN AEROBIC AND ANAEROBIC Blood Culture adequate volume   Culture   Final    NO GROWTH 3 DAYS Performed at [facility], [address] [place], [place] [address]    Report Status PENDING  Incomplete  Blood culture (routine single)     Status: Abnormal   Collection Time: 09/28/20  6:16 PM   Specimen: BLOOD  Result Value Ref Range Status   Specimen Description   Final    BLOOD BLOOD LEFT HAND Performed at [facility],  [address] [place], [place] [address]    Special Requests   Final    BOTTLES DRAWN AEROBIC AND ANAEROBIC Blood Culture results may not be optimal due to an inadequate volume of blood received in culture bottles Performed at [facility], [address] [place], [place] [address]    Culture  Setup Time   Final    GRAM POSITIVE COCCI IN BOTH AEROBIC AND ANAEROBIC BOTTLES CRITICAL RESULT CALLED TO, READ BACK BY AND VERIFIED WITH: KISHAN PATEL ON 09/29/20 AT 1317 QSD    Culture (A)  Final    STAPHYLOCOCCUS EPIDERMIDIS STAPHYLOCOCCUS HOMINIS THE SIGNIFICANCE OF ISOLATING THIS ORGANISM FROM A SINGLE SET OF BLOOD CULTURES WHEN MULTIPLE SETS ARE DRAWN IS UNCERTAIN. PLEASE NOTIFY THE MICROBIOLOGY DEPARTMENT WITHIN ONE WEEK IF SPECIATION AND SENSITIVITIES ARE REQUIRED. Performed at [facility], [address] [place], [place] [address]    Report Status 10/01/2020 FINAL  Final  Urine culture     Status: Abnormal (Preliminary result)   Collection Time: 09/28/20  6:16 PM   Specimen: Urine, Random  Result Value Ref Range Status   Specimen Description   Final    URINE, RANDOM Performed at [facility], [address] [place], [place] [address]    Special Requests   Final    NONE Performed at [facility], [address] [place], [place] [address]    Culture (A)  Final    >=100,000 COLONIES/mL LACTOBACILLUS SPECIES Standardized susceptibility testing for this organism is not available. 10,000 COLONIES/mL ESCHERICHIA COLI    Report Status PENDING  Incomplete  Blood Culture ID Panel (Reflexed)     Status: Abnormal   Collection Time: 09/28/20  6:16 PM  Result Value Ref Range Status   Enterococcus faecalis NOT DETECTED NOT DETECTED Final   Enterococcus Faecium NOT DETECTED NOT DETECTED Final   Listeria monocytogenes NOT DETECTED NOT DETECTED Final   Staphylococcus species DETECTED (A) NOT DETECTED Final    Comment: CRITICAL RESULT CALLED TO, READ  BACK BY AND VERIFIED WITH: KISHAN PATEL ON 09/29/20 AT 1317 QSD    Staphylococcus aureus (BCID) NOT DETECTED NOT DETECTED Final   Staphylococcus epidermidis DETECTED (A) NOT DETECTED Final    Comment: CRITICAL RESULT CALLED TO, READ BACK BY AND VERIFIED WITH: KISHAN PATEL ON 09/29/20 AT 1317 QSD    Staphylococcus lugdunensis NOT DETECTED NOT DETECTED Final   Streptococcus species NOT DETECTED NOT  DETECTED Final   Streptococcus agalactiae NOT DETECTED NOT DETECTED Final   Streptococcus pneumoniae NOT DETECTED NOT DETECTED Final   Streptococcus pyogenes NOT DETECTED NOT DETECTED Final   A.calcoaceticus-baumannii NOT DETECTED NOT DETECTED Final   Bacteroides fragilis NOT DETECTED NOT DETECTED Final   Enterobacterales NOT DETECTED NOT DETECTED Final   Enterobacter cloacae complex NOT DETECTED NOT DETECTED Final   Escherichia coli NOT DETECTED NOT DETECTED Final   Klebsiella aerogenes NOT DETECTED NOT DETECTED Final   Klebsiella oxytoca NOT DETECTED NOT DETECTED Final   Klebsiella pneumoniae NOT DETECTED NOT DETECTED Final   Proteus species NOT DETECTED NOT DETECTED Final   Salmonella species NOT DETECTED NOT DETECTED Final   Serratia marcescens NOT DETECTED NOT DETECTED Final   Haemophilus influenzae NOT DETECTED NOT DETECTED Final   Neisseria meningitidis NOT DETECTED NOT DETECTED Final   Pseudomonas aeruginosa NOT DETECTED NOT DETECTED Final   Stenotrophomonas maltophilia NOT DETECTED NOT DETECTED Final   Candida albicans NOT DETECTED NOT DETECTED Final   Candida auris NOT DETECTED NOT DETECTED Final   Candida glabrata NOT DETECTED NOT DETECTED Final   Candida krusei NOT DETECTED NOT DETECTED Final   Candida parapsilosis NOT DETECTED NOT DETECTED Final   Candida tropicalis NOT DETECTED NOT DETECTED Final   Cryptococcus neoformans/gattii NOT DETECTED NOT DETECTED Final   Methicillin resistance mecA/C NOT DETECTED NOT DETECTED Final    Comment: Performed at [facility], [address] [place], [place] [address]  CULTURE, BLOOD (ROUTINE X 2) w Reflex to ID Panel     Status: None (Preliminary result)   Collection Time: 09/30/20  4:13 PM   Specimen: BLOOD  Result Value Ref Range Status   Specimen Description BLOOD LEFT ANTECUBITAL  Final   Special Requests   Final    BOTTLES DRAWN AEROBIC AND ANAEROBIC Blood Culture adequate volume   Culture   Final    NO GROWTH < 24 HOURS Performed at [facility], [address] [place], [place] [address]    Report Status PENDING  Incomplete  CULTURE, BLOOD (ROUTINE X 2) w Reflex to ID Panel     Status: None (Preliminary result)   Collection Time: 09/30/20  4:13 PM   Specimen: BLOOD  Result Value Ref Range Status   Specimen Description BLOOD BLOOD LEFT HAND  Final   Special Requests   Final    BOTTLES DRAWN AEROBIC AND ANAEROBIC Blood Culture adequate volume   Culture   Final    NO GROWTH < 24 HOURS Performed at [facility], [address] [place], [place] [address]    Report Status PENDING  Incomplete         Radiology Studies: No results found.      Scheduled Meds: . apixaban  2.5 mg Oral BID  . donepezil  10 mg Oral Daily  . feeding supplement  237 mL Oral BID BM  . metoprolol succinate  12.5 mg Oral BID  . midodrine  5 mg Oral TID WC  . multivitamin-lutein  1 capsule Oral Daily   Continuous Infusions: . sodium chloride 75 mL/hr at 10/01/20 1506  . cefTRIAXone (ROCEPHIN)  IV Stopped (09/30/20 2132)    Assessment & Plan:   Principal Problem:   Severe sepsis (HCC) Active Problems:   Benign essential hypertension   Chronic anemia   Mixed Alzheimer's and vascular dementia (HCC)   AKI (acute kidney injury) (HCC)   Sacral decubitus ulcer   Cellulitis   AF (paroxysmal atrial fibrillation) (HCC)  Chronic anticoagulation   Functional quadriplegia (HCC)   History of lacunar cerebrovascular accident   UTI (urinary tract infection)   80 year old female with history of  paroxysmal A. fib on Eliquis, Alzheimer's disease and total care dependent, hypertension, hx of lacunar infarct and osteoarthritis sent from PCP with concern for sacral skin breakdown associated with decreased oral intake and generalized weakness.      Severe sepsis (HCC)   Cellulitis/Blisters/ infected decubitus left buttock   UTI  - Patient  meeting sepsis criteria including leukocytosis, hypotension, tachycardia, AKI and tachypnea - Sepsis source, suspect cellulitis/infected decubitus ulcer as well as UTI 1/21-afebrile, leukocytosis improved.  Ucx + ,for >100 lactobacillus, no need for sensiti. As can be part of flora 1/2 bcx staph epi- likely contaminent Lactic acid up ? Possibly due to dehydration . Will start gentle hydration Repeat bcx TDN, will f/u final results vanco was discontinued See wound care nsg note      AKI (acute kidney injury) (HCC) - Creatinine 2.15, up from 1.0 on 1/10 at doctor's office - Likely  related to combination of sepsis and decreased oral intake 1/21-improved with hydration and remains stable Avoid nephrotoxic medications       AF (paroxysmal atrial fibrillation) with RVR   Chronic anticoagulation - Ventricular response increase  likely secondary to sepsis/dehydration 1/21-slowly improving.  Will increase midodrine to 5mg  tid to increase bp so can increase toprol xl Continue Eliquis      Benign essential hypertension - Patient with soft blood pressures related to sepsis on arrival Antihypertensive held initially Increase midodrine see above.      Chronic anemia - Hemoglobin 9.1 which is baseline Continue monitoring  Stool occult negative.      Mixed Alzheimer's and vascular dementia (HCC)   Functional quadriplegia (HCC) - According to daughter, patient is total care dependent and needs assistance in all ADLs - Dysphagia 2 diet and nutritionist eval - TOC consulted. Daughter would be interested in skilled rehab but states her  father might be less interested - Fall and aspiration precautions and increase nursing assistance SPL saw patient please see note    History of lacunar cerebrovascular accident -  On statin    PT/OT consult  DVT prophylaxis: Eliquis Code Status: DNR Family Communication:spoke to daughter  Status is: Inpatient  Remains inpatient appropriate because:Inpatient level of care appropriate due to severity of illness   Dispo: The patient is from: Home              Anticipated d/c is to: TBD. daughter interested in skilled rehab              Anticipated d/c date is: 3 days              Patient currently is not medically stable to d/c. Heart rate still elevated,, need gentle hydration. placment pending SNF           LOS: 3 days   Time spent: 35 minutes with more than 50% on COC    , MD [facility] Pager [phone]  If 7PM-7AM, please contact night-coverage 10/01/2020, 3:29 PM

## 2020-10-01 NOTE — Care Management Important Message (Signed)
Important Message  Patient Details  Name: Laurie Jackson MRN: [id number] Date of Birth: 1941-03-18   Medicare Important Message Given:  Yes     [name] 10/01/2020, 1:49 PM

## 2020-10-02 LAB — GLUCOSE, CAPILLARY
Glucose-Capillary: 87 mg/dL (ref 70–99)
Glucose-Capillary: 114 mg/dL — ABNORMAL HIGH (ref 70–99)
Glucose-Capillary: 89 mg/dL (ref 70–99)

## 2020-10-02 LAB — BASIC METABOLIC PANEL
Anion gap: 10 (ref 5–15)
BUN: 16 mg/dL (ref 8–23)
CO2: 20 mmol/L — ABNORMAL LOW (ref 22–32)
Calcium: 8.4 mg/dL — ABNORMAL LOW (ref 8.9–10.3)
Chloride: 112 mmol/L — ABNORMAL HIGH (ref 98–111)
Creatinine, Ser: 0.77 mg/dL (ref 0.44–1.00)
GFR, Estimated: >60 mL/min (ref >60)
Glucose, Bld: 95 mg/dL (ref 70–99)
Potassium: 3.5 mmol/L (ref 3.5–5.1)
Sodium: 142 mmol/L (ref 135–145)

## 2020-10-02 LAB — URINE CULTURE: Culture: >=100000 — AB

## 2020-10-02 LAB — LACTIC ACID, PLASMA
Lactic Acid, Venous: 1.2 mmol/L (ref 0.5–1.9)
Lactic Acid, Venous: 1.6 mmol/L (ref 0.5–1.9)

## 2020-10-02 MED ORDER — METOPROLOL SUCCINATE ER 25 MG PO TB24
25.0000 mg | ORAL_TABLET | Freq: Two times a day (BID) | ORAL | Status: DC
  Administered 2020-10-02 – 2020-10-03 (×2): 25 mg via ORAL
  Filled 2020-10-02 (×2): qty 1

## 2020-10-02 NOTE — Progress Notes (Signed)
      INFECTIOUS DISEASE ATTENDING ADDENDUM:   Date: 10/02/2020  Patient name: Laurie Jackson  Medical record number: [id number]  Date of birth: September 30, 1940   I was called by Dr. Marylu Lund re the Staph hominis isolated in 1/2 blood cultures. This is a contaminant.  She is currently on ceftriaxone for cellulitis  She does not have a UTI with lactobacillus not a pathogenic organism at this site and her unlikely to have 2 infectious diagnoses. Furthermore it does nto appear that she had any complaints of dysuria but ratehr the rash and malaise.     Acey Lav 10/02/2020, 5:49 PM

## 2020-10-02 NOTE — Plan of Care (Signed)

## 2020-10-02 NOTE — Progress Notes (Addendum)
PROGRESS NOTE    Laurie Jackson  CVE:[id number] DOB: [birth year] DOA: 09/28/2020 PCP: Marguarite Arbour, MD    Brief Narrative:  Laurie Jackson is a 80 y.o. female with medical history significant for Paroxysmal A. fib on Eliquis, Alzheimer's disease and total care dependent, hypertension, hx of lacunar infarct and osteoarthritis who presented to the emergency room on the advice of her PCP after she developed an area of skin breakdown on her buttock a week ago that has been rapidly progressing.  Most of the history is taken from patient's daughter, Laurie Jackson due to her mother's dementia.  She states that her mother has been rapidly declining over the past 2 months and currently she needs assistance for all activities of daily living including feeding, grooming and transfers.  Daughter has moved to live with her parents to assist her father and providing full-time care to her mother .  She states that over the past couple weeks her mother has  been very weak and has been spending most of the time in the bed. Physical therapy was ordered however patient was unable to participate with them.  Over the past week they noticed the area of skin blistering and was thus advised by the PCP to present to the emergency room.  Found with A. fib RVR  1/19- afib rvr today  1/20-ms better today.  Hr improving  1/21-spoke to daughter, states moms MS much improved 85%.  HR better controlled.  1/22-no overnight issues. Elevated lactic acid improved with ivf.   Consultants:     Procedures:   Antimicrobials:    ceftriaxone  Subjective: No complaints of sob, chills, cp  Objective: Vitals:   10/01/20 1958 10/02/20 0359 10/02/20 0810 10/02/20 1225  BP: 133/85 (!) 144/101 (!) 147/89 136/74  Pulse: (!) 103 (!) 52 85 100  Resp: 20 17 16 18   Temp: 98.9 F (37.2 C) 98.9 F (37.2 C) 98.6 F (37 C) 98 F (36.7 C)  TempSrc: Oral Oral  Axillary  SpO2: 100% 99% 100% 98%  Weight:  54.5 kg    Height:         Intake/Output Summary (Last 24 hours) at 10/02/2020 1529 Last data filed at 10/02/2020 1300 Gross per 24 hour  Intake 120 ml  Output 400 ml  Net -280 ml   Filed Weights   09/29/20 2306 10/01/20 0358 10/02/20 0359  Weight: 56.3 kg 57.4 kg 54.5 kg    Examination: Calm, comfortable CTA, no wheeze rales rhonchi's Regular S1-S2 no gallops Soft benign positive bowel sounds No edema Awake alert to date and person not to place Mood and affect appropriate and at baseline      Data Reviewed: I have personally reviewed following labs and imaging studies  CBC: Recent Labs  Lab 09/28/20 1811 09/29/20 0652 10/01/20 0409  WBC 14.2* 9.9 8.7  NEUTROABS 9.1*  --   --   HGB 10.6* 9.1* 8.7*  HCT 32.4* 27.4* 26.7*  MCV 86.4 85.6 85.9  PLT 629* 524* 518*   Basic Metabolic Panel: Recent Labs  Lab 09/28/20 1811 09/29/20 0652 09/30/20 0607 10/01/20 0409 10/02/20 0926  NA 143 139 140 139 142  K 4.0 3.6 3.5 3.3* 3.5  CL 105 107 107 107 112*  CO2 23 21* 22 19* 20*  GLUCOSE 115* 99 93 93 95  BUN 48* 33* 25* 23 16  CREATININE 2.15* 1.17* 0.88 0.87 0.77  CALCIUM 9.7 8.8* 8.8* 8.8* 8.4*  MG  --   --   --  1.6*  --    GFR: Estimated Creatinine Clearance: 50.7 mL/min (by C-G formula based on SCr of 0.77 mg/dL). Liver Function Tests: Recent Labs  Lab 09/28/20 1811  AST 17  ALT 17  ALKPHOS 43  BILITOT 0.9  PROT 7.2  ALBUMIN 3.0*   No results for input(s): LIPASE, AMYLASE in the last 168 hours. No results for input(s): AMMONIA in the last 168 hours. Coagulation Profile: Recent Labs  Lab 09/28/20 1811 09/29/20 0652  INR 1.9* 1.8*   Cardiac Enzymes: No results for input(s): CKTOTAL, CKMB, CKMBINDEX, TROPONINI in the last 168 hours. BNP (last 3 results) No results for input(s): PROBNP in the last 8760 hours. HbA1C: No results for input(s): HGBA1C in the last 72 hours. CBG: Recent Labs  Lab 10/02/20 0811 10/02/20 1312  GLUCAP 87 114*   Lipid Profile: No  results for input(s): CHOL, HDL, LDLCALC, TRIG, CHOLHDL, LDLDIRECT in the last 72 hours. Thyroid Function Tests: No results for input(s): TSH, T4TOTAL, FREET4, T3FREE, THYROIDAB in the last 72 hours. Anemia Panel: No results for input(s): VITAMINB12, FOLATE, FERRITIN, TIBC, IRON, RETICCTPCT in the last 72 hours. Sepsis Labs: Recent Labs  Lab 09/29/20 0652 09/30/20 0923 10/01/20 0917 10/01/20 1150 10/02/20 0926 10/02/20 1133  PROCALCITON 0.14  --   --   --   --   --   LATICACIDVEN  --    < > 1.3 3.6* 1.2 1.6   < > = values in this interval not displayed.    Recent Results (from the past 240 hour(s))  Resp Panel by RT-PCR (Flu A&B, Covid) Nasopharyngeal Swab     Status: None   Collection Time: 09/28/20  6:12 PM   Specimen: Nasopharyngeal Swab; Nasopharyngeal(NP) swabs in vial transport medium  Result Value Ref Range Status   SARS Coronavirus 2 by RT PCR NEGATIVE NEGATIVE Final    Comment: (NOTE) SARS-CoV-2 target nucleic acids are NOT DETECTED.  The SARS-CoV-2 RNA is generally detectable in upper respiratory specimens during the acute phase of infection. The lowest concentration of SARS-CoV-2 viral copies this assay can detect is 138 copies/mL. A negative result does not preclude SARS-Cov-2 infection and should not be used as the sole basis for treatment or other patient management decisions. A negative result may occur with  improper specimen collection/handling, submission of specimen other than nasopharyngeal swab, presence of viral mutation(s) within the areas targeted by this assay, and inadequate number of viral copies(<138 copies/mL). A negative result must be combined with clinical observations, patient history, and epidemiological information. The expected result is Negative.  Fact Sheet for Patients:  BloggerCourse.com  Fact Sheet for Healthcare Providers:  SeriousBroker.it  This test is no t yet approved or  cleared by the [place] FDA and  has been authorized for detection and/or diagnosis of SARS-CoV-2 by FDA under an Emergency Use Authorization (EUA). This EUA will remain  in effect (meaning this test can be used) for the duration of the COVID-19 declaration under Section 564(b)(1) of the Act, 21 U.S.C.section 360bbb-3(b)(1), unless the authorization is terminated  or revoked sooner.       Influenza A by PCR NEGATIVE NEGATIVE Final   Influenza B by PCR NEGATIVE NEGATIVE Final    Comment: (NOTE) The Xpert Xpress SARS-CoV-2/FLU/RSV plus assay is intended as an aid in the diagnosis of influenza from Nasopharyngeal swab specimens and should not be used as a sole basis for treatment. Nasal washings and aspirates are unacceptable for Xpert Xpress SARS-CoV-2/FLU/RSV testing.  Fact Sheet for Patients: BloggerCourse.com  Fact Sheet for Healthcare Providers: SeriousBroker.it  This test is not yet approved or cleared by the [place] FDA and has been authorized for detection and/or diagnosis of SARS-CoV-2 by FDA under an Emergency Use Authorization (EUA). This EUA will remain in effect (meaning this test can be used) for the duration of the COVID-19 declaration under Section 564(b)(1) of the Act, 21 U.S.C. section 360bbb-3(b)(1), unless the authorization is terminated or revoked.  Performed at [facility], [address] [place], [place] [address]   Blood culture (single)     Status: None (Preliminary result)   Collection Time: 09/28/20  6:12 PM   Specimen: BLOOD  Result Value Ref Range Status   Specimen Description BLOOD BLOOD RIGHT FOREARM  Final   Special Requests   Final    BOTTLES DRAWN AEROBIC AND ANAEROBIC Blood Culture adequate volume   Culture   Final    NO GROWTH 4 DAYS Performed at [facility], [address] [place], [place] [address]    Report Status PENDING  Incomplete  Blood culture  (routine single)     Status: Abnormal   Collection Time: 09/28/20  6:16 PM   Specimen: BLOOD  Result Value Ref Range Status   Specimen Description   Final    BLOOD BLOOD LEFT HAND Performed at [facility], [address] [place], [place] [address]    Special Requests   Final    BOTTLES DRAWN AEROBIC AND ANAEROBIC Blood Culture results may not be optimal due to an inadequate volume of blood received in culture bottles Performed at [facility], [address] [place], [place] [address]    Culture  Setup Time   Final    GRAM POSITIVE COCCI IN BOTH AEROBIC AND ANAEROBIC BOTTLES CRITICAL RESULT CALLED TO, READ BACK BY AND VERIFIED WITH: KISHAN PATEL ON 09/29/20 AT 1317 QSD    Culture (A)  Final    STAPHYLOCOCCUS EPIDERMIDIS STAPHYLOCOCCUS HOMINIS THE SIGNIFICANCE OF ISOLATING THIS ORGANISM FROM A SINGLE SET OF BLOOD CULTURES WHEN MULTIPLE SETS ARE DRAWN IS UNCERTAIN. PLEASE NOTIFY THE MICROBIOLOGY DEPARTMENT WITHIN ONE WEEK IF SPECIATION AND SENSITIVITIES ARE REQUIRED. Performed at [facility], [address] [place], [place] [address]    Report Status 10/01/2020 FINAL  Final  Urine culture     Status: Abnormal   Collection Time: 09/28/20  6:16 PM   Specimen: Urine, Random  Result Value Ref Range Status   Specimen Description   Final    URINE, RANDOM Performed at [facility], [address], [place], [place] [address]    Special Requests   Final    NONE Performed at [facility], [address] [place], [place] [address]    Culture (A)  Final    >=100,000 COLONIES/mL LACTOBACILLUS SPECIES Standardized susceptibility testing for this organism is not available. 10,000 COLONIES/mL ESCHERICHIA COLI    Report Status 10/02/2020 FINAL  Final   Organism ID, Bacteria ESCHERICHIA COLI (A)  Final      Susceptibility   Escherichia coli - MIC*    AMPICILLIN <=2 SENSITIVE Sensitive     CEFAZOLIN <=4 SENSITIVE Sensitive     CEFEPIME  <=0.12 SENSITIVE Sensitive     CEFTRIAXONE <=0.25 SENSITIVE Sensitive     CIPROFLOXACIN <=0.25 SENSITIVE Sensitive     GENTAMICIN <=1 SENSITIVE Sensitive     IMIPENEM <=0.25 SENSITIVE Sensitive     NITROFURANTOIN <=16 SENSITIVE Sensitive     TRIMETH/SULFA <=20 SENSITIVE Sensitive  AMPICILLIN/SULBACTAM <=2 SENSITIVE Sensitive     PIP/TAZO <=4 SENSITIVE Sensitive     * 10,000 COLONIES/mL ESCHERICHIA COLI  Blood Culture ID Panel (Reflexed)     Status: Abnormal   Collection Time: 09/28/20  6:16 PM  Result Value Ref Range Status   Enterococcus faecalis NOT DETECTED NOT DETECTED Final   Enterococcus Faecium NOT DETECTED NOT DETECTED Final   Listeria monocytogenes NOT DETECTED NOT DETECTED Final   Staphylococcus species DETECTED (A) NOT DETECTED Final    Comment: CRITICAL RESULT CALLED TO, READ BACK BY AND VERIFIED WITH: KISHAN PATEL ON 09/29/20 AT 1317 QSD    Staphylococcus aureus (BCID) NOT DETECTED NOT DETECTED Final   Staphylococcus epidermidis DETECTED (A) NOT DETECTED Final    Comment: CRITICAL RESULT CALLED TO, READ BACK BY AND VERIFIED WITH: KISHAN PATEL ON 09/29/20 AT 1317 QSD    Staphylococcus lugdunensis NOT DETECTED NOT DETECTED Final   Streptococcus species NOT DETECTED NOT DETECTED Final   Streptococcus agalactiae NOT DETECTED NOT DETECTED Final   Streptococcus pneumoniae NOT DETECTED NOT DETECTED Final   Streptococcus pyogenes NOT DETECTED NOT DETECTED Final   A.calcoaceticus-baumannii NOT DETECTED NOT DETECTED Final   Bacteroides fragilis NOT DETECTED NOT DETECTED Final   Enterobacterales NOT DETECTED NOT DETECTED Final   Enterobacter cloacae complex NOT DETECTED NOT DETECTED Final   Escherichia coli NOT DETECTED NOT DETECTED Final   Klebsiella aerogenes NOT DETECTED NOT DETECTED Final   Klebsiella oxytoca NOT DETECTED NOT DETECTED Final   Klebsiella pneumoniae NOT DETECTED NOT DETECTED Final   Proteus species NOT DETECTED NOT DETECTED Final   Salmonella species NOT  DETECTED NOT DETECTED Final   Serratia marcescens NOT DETECTED NOT DETECTED Final   Haemophilus influenzae NOT DETECTED NOT DETECTED Final   Neisseria meningitidis NOT DETECTED NOT DETECTED Final   Pseudomonas aeruginosa NOT DETECTED NOT DETECTED Final   Stenotrophomonas maltophilia NOT DETECTED NOT DETECTED Final   Candida albicans NOT DETECTED NOT DETECTED Final   Candida auris NOT DETECTED NOT DETECTED Final   Candida glabrata NOT DETECTED NOT DETECTED Final   Candida krusei NOT DETECTED NOT DETECTED Final   Candida parapsilosis NOT DETECTED NOT DETECTED Final   Candida tropicalis NOT DETECTED NOT DETECTED Final   Cryptococcus neoformans/gattii NOT DETECTED NOT DETECTED Final   Methicillin resistance mecA/C NOT DETECTED NOT DETECTED Final    Comment: Performed at [facility], [address] [place], [place] [address]  CULTURE, BLOOD (ROUTINE X 2) w Reflex to ID Panel     Status: None (Preliminary result)   Collection Time: 09/30/20  4:13 PM   Specimen: BLOOD  Result Value Ref Range Status   Specimen Description BLOOD LEFT ANTECUBITAL  Final   Special Requests   Final    BOTTLES DRAWN AEROBIC AND ANAEROBIC Blood Culture adequate volume   Culture   Final    NO GROWTH 2 DAYS Performed at [facility], [address] [place], [place] [address]    Report Status PENDING  Incomplete  CULTURE, BLOOD (ROUTINE X 2) w Reflex to ID Panel     Status: None (Preliminary result)   Collection Time: 09/30/20  4:13 PM   Specimen: BLOOD  Result Value Ref Range Status   Specimen Description BLOOD BLOOD LEFT HAND  Final   Special Requests   Final    BOTTLES DRAWN AEROBIC AND ANAEROBIC Blood Culture adequate volume   Culture   Final    NO GROWTH 2 DAYS Performed at [facility], [address] [place], [place] [id number]    Report Status PENDING  Incomplete         Radiology Studies: No results found.      Scheduled Meds: . apixaban  2.5 mg Oral BID   . donepezil  10 mg Oral Daily  . feeding supplement  237 mL Oral BID BM  . metoprolol succinate  12.5 mg Oral BID  . midodrine  5 mg Oral TID WC  . multivitamin-lutein  1 capsule Oral Daily   Continuous Infusions: . sodium chloride 50 mL/hr at 10/01/20 1714  . cefTRIAXone (ROCEPHIN)  IV 2 g (10/01/20 2013)    Assessment & Plan:   Principal Problem:   Severe sepsis (HCC) Active Problems:   Benign essential hypertension   Chronic anemia   Mixed Alzheimer's and vascular dementia (HCC)   AKI (acute kidney injury) (HCC)   Sacral decubitus ulcer   Cellulitis   AF (paroxysmal atrial fibrillation) (HCC)   Chronic anticoagulation   Functional quadriplegia (HCC)   History of lacunar cerebrovascular accident   UTI (urinary tract infection)   [age] year old female with history of paroxysmal A. fib on Eliquis, Alzheimer's disease and total care dependent, hypertension, hx of lacunar infarct and osteoarthritis sent from PCP with concern for sacral skin breakdown associated with decreased oral intake and generalized weakness.      Severe sepsis (HCC)   Cellulitis/Blisters/ infected decubitus left buttock   UTI  - Patient  meeting sepsis criteria including leukocytosis, hypotension, tachycardia, AKI and tachypnea - Sepsis source, suspect cellulitis/infected decubitus ulcer as well as UTI 1/21-afebrile, leukocytosis improved.  Ucx + ,for >100 lactobacillus, more than 10,000 E. Coli 1/2 bcx staph epi-and staph hominis likely contaminent-discussed with ID per Dr. Algis LimingVandam 1/22-lactic acid improved with ivf for hydration Repeat bcx TDN, f/u final results Continue ceftriaxone See wound care nursing note      AKI (acute kidney injury) (HCC) - Creatinine 2.15, up from 1.0 on 1/10 at doctor's office -  Likely related to combination of sepsis and decreased p.o. intake  Improved with hydration and remained stable  Avoid nephrotoxic medications         AF (paroxysmal atrial fibrillation)  with RVR   Chronic anticoagulation - Ventricular response increase  likely secondary to sepsis/dehydration Improving but heart rate still has room for improvement Continue midodrine for BP up so can increase beta-blockers Increase Toprol-XL to 25 mg twice daily Continue Eliquis      Benign essential hypertension Was on the soft side initially related to sepsis. Started on midodrine Continue beta-blockers       Chronic anemia - Hemoglobin 9.1 which is baseline Continue monitoring  Stool occult negative.      Mixed Alzheimer's and vascular dementia (HCC)   Functional quadriplegia (HCC) - According to daughter, patient is total care dependent and needs assistance in all ADLs - Dysphagia 2 diet and nutritionist eval - TOC consulted. Daughter would be interested in skilled rehab but states her father might be less interested - Fall and aspiration precautions and increase nursing assistance SPL saw patient please see note    History of lacunar cerebrovascular accident -  On statin      DVT prophylaxis: Eliquis Code Status: DNR Family Communication:spoke to daughter  Status is: Inpatient  Remains inpatient appropriate because:Inpatient level of care appropriate due to severity of illness   Dispo: The patient is from: Home              Anticipated d/c is to: SNF  Anticipated d/c date is:TBD              Patient currently is not medically stable to d/c. Heart rate still elevated,, need gentle hydration. placment pending SNF           LOS: 4 days   Time spent: 35 minutes with more than 50% on COC    Lynn ItoSahar Rindy Kollman, MD [facility] Pager [phone]  If 7PM-7AM, please contact night-coverage 10/02/2020, 3:29 PM

## 2020-10-02 NOTE — Progress Notes (Signed)
Chart reviewed. Rec continue with current Dysphagia 1 (pureed diet.) with thin liquids. No further ST f/u at this time unless notified of any difficulty with current diet.

## 2020-10-03 LAB — CULTURE, BLOOD (SINGLE)
Culture: NO GROWTH
Special Requests: ADEQUATE

## 2020-10-03 MED ORDER — METOPROLOL SUCCINATE ER 50 MG PO TB24: 50.0000 mg | ORAL_TABLET | Freq: Two times a day (BID) | ORAL | Status: DC

## 2020-10-03 MED ORDER — SODIUM CHLORIDE 0.9 % IV SOLN
INTRAVENOUS | Status: DC | PRN
  Administered 2020-10-03: 10 mL via INTRAVENOUS
  Administered 2020-10-04: 250 mL via INTRAVENOUS

## 2020-10-03 MED ORDER — METOPROLOL TARTRATE 50 MG PO TABS
50.0000 mg | ORAL_TABLET | Freq: Two times a day (BID) | ORAL | Status: DC
  Administered 2020-10-03 – 2020-10-04 (×3): 50 mg via ORAL
  Filled 2020-10-03 (×3): qty 1

## 2020-10-03 NOTE — Progress Notes (Signed)
PROGRESS NOTE    Laurie Jackson  ZOX:[id number] DOB: 07/24/1941 DOA: 09/28/2020 PCP: Marguarite ArbourSparks, Jeffrey D, MD    Brief Narrative:  Laurie ReeCarolyn Barrilleaux is a 80 y.o. female with medical history significant for Paroxysmal A. fib on Eliquis, Alzheimer's disease and total care dependent, hypertension, hx of lacunar infarct and osteoarthritis who presented to the emergency room on the advice of her PCP after she developed an area of skin breakdown on her buttock a week ago that has been rapidly progressing.  Most of the history is taken from patient's daughter, Laurie Jackson due to her mother's dementia.  She states that her mother has been rapidly declining over the past 2 months and currently she needs assistance for all activities of daily living including feeding, grooming and transfers.  Daughter has moved to live with her parents to assist her father and providing full-time care to her mother .  She states that over the past couple weeks her mother has  been very weak and has been spending most of the time in the bed. Physical therapy was ordered however patient was unable to participate with them.  Over the past week they noticed the area of skin blistering and was thus advised by the PCP to present to the emergency room.  Found with A. fib RVR  1/19- afib rvr today  1/20-ms better today.  Hr improving  1/21-spoke to daughter, states moms MS much improved 85%.  HR better controlled.  1/22-no overnight issues. Elevated lactic acid improved with ivf.  1/23-no overnight issues. Daughter at bedside and reports good po intake but has to remind her to eat.had a run of svt, hr still elevated  Consultants:     Procedures:   Antimicrobials:    ceftriaxone  Subjective: Sitting in bed eating breakfast has no complaints.  Denies shortness of breath or chest pain.  Denies dizziness.   Objective: Vitals:   10/02/20 1807 10/02/20 1958 10/03/20 0417 10/03/20 0807  BP: 138/76 (!) 159/82 119/85 (!)  116/93  Pulse: 96 77 81 100  Resp: 16 19 17 16   Temp: 97.9 F (36.6 C) 99.2 F (37.3 C) 98.9 F (37.2 C) 99 F (37.2 C)  TempSrc: Axillary Oral Oral   SpO2: 97% 96% 100% 97%  Weight:   55.6 kg   Height:        Intake/Output Summary (Last 24 hours) at 10/03/2020 0842 Last data filed at 10/03/2020 0500 Gross per 24 hour  Intake 310 ml  Output 400 ml  Net -90 ml   Filed Weights   10/01/20 0358 10/02/20 0359 10/03/20 0417  Weight: 57.4 kg 54.5 kg 55.6 kg    Examination: Calm, pleasant CTA, no wheeze rales rhonchi Regular S1-S2 no murmurs rubs gallops Soft benign positive bowel sounds No edema Grossly intact Mood and affect appropriate in current setting      Data Reviewed: I have personally reviewed following labs and imaging studies  CBC: Recent Labs  Lab 09/28/20 1811 09/29/20 0652 10/01/20 0409  WBC 14.2* 9.9 8.7  NEUTROABS 9.1*  --   --   HGB 10.6* 9.1* 8.7*  HCT 32.4* 27.4* 26.7*  MCV 86.4 85.6 85.9  PLT 629* 524* 518*   Basic Metabolic Panel: Recent Labs  Lab 09/28/20 1811 09/29/20 0652 09/30/20 0607 10/01/20 0409 10/02/20 0926  NA 143 139 140 139 142  K 4.0 3.6 3.5 3.3* 3.5  CL 105 107 107 107 112*  CO2 23 21* 22 19* 20*  GLUCOSE 115* 99 93 93  95  BUN 48* 33* 25* 23 16  CREATININE 2.15* 1.17* 0.88 0.87 0.77  CALCIUM 9.7 8.8* 8.8* 8.8* 8.4*  MG  --   --   --  1.6*  --    GFR: Estimated Creatinine Clearance: 51.7 mL/min (by C-G formula based on SCr of 0.77 mg/dL). Liver Function Tests: Recent Labs  Lab 09/28/20 1811  AST 17  ALT 17  ALKPHOS 43  BILITOT 0.9  PROT 7.2  ALBUMIN 3.0*   No results for input(s): LIPASE, AMYLASE in the last 168 hours. No results for input(s): AMMONIA in the last 168 hours. Coagulation Profile: Recent Labs  Lab 09/28/20 1811 09/29/20 0652  INR 1.9* 1.8*   Cardiac Enzymes: No results for input(s): CKTOTAL, CKMB, CKMBINDEX, TROPONINI in the last 168 hours. BNP (last 3 results) No results for  input(s): PROBNP in the last 8760 hours. HbA1C: No results for input(s): HGBA1C in the last 72 hours. CBG: Recent Labs  Lab 10/02/20 0811 10/02/20 1312 10/02/20 1806  GLUCAP 87 114* 89   Lipid Profile: No results for input(s): CHOL, HDL, LDLCALC, TRIG, CHOLHDL, LDLDIRECT in the last 72 hours. Thyroid Function Tests: No results for input(s): TSH, T4TOTAL, FREET4, T3FREE, THYROIDAB in the last 72 hours. Anemia Panel: No results for input(s): VITAMINB12, FOLATE, FERRITIN, TIBC, IRON, RETICCTPCT in the last 72 hours. Sepsis Labs: Recent Labs  Lab 09/29/20 0652 09/30/20 0923 10/01/20 0917 10/01/20 1150 10/02/20 0926 10/02/20 1133  PROCALCITON 0.14  --   --   --   --   --   LATICACIDVEN  --    < > 1.3 3.6* 1.2 1.6   < > = values in this interval not displayed.    Recent Results (from the past 240 hour(s))  Resp Panel by RT-PCR (Flu A&B, Covid) Nasopharyngeal Swab     Status: None   Collection Time: 09/28/20  6:12 PM   Specimen: Nasopharyngeal Swab; Nasopharyngeal(NP) swabs in vial transport medium  Result Value Ref Range Status   SARS Coronavirus 2 by RT PCR NEGATIVE NEGATIVE Final    Comment: (NOTE) SARS-CoV-2 target nucleic acids are NOT DETECTED.  The SARS-CoV-2 RNA is generally detectable in upper respiratory specimens during the acute phase of infection. The lowest concentration of SARS-CoV-2 viral copies this assay can detect is 138 copies/mL. A negative result does not preclude SARS-Cov-2 infection and should not be used as the sole basis for treatment or other patient management decisions. A negative result may occur with  improper specimen collection/handling, submission of specimen other than nasopharyngeal swab, presence of viral mutation(s) within the areas targeted by this assay, and inadequate number of viral copies(<138 copies/mL). A negative result must be combined with clinical observations, patient history, and epidemiological information. The expected  result is Negative.  Fact Sheet for Patients:  BloggerCourse.com  Fact Sheet for Healthcare Providers:  SeriousBroker.it  This test is no t yet approved or cleared by the [place] FDA and  has been authorized for detection and/or diagnosis of SARS-CoV-2 by FDA under an Emergency Use Authorization (EUA). This EUA will remain  in effect (meaning this test can be used) for the duration of the COVID-19 declaration under Section 564(b)(1) of the Act, 21 U.S.C.section 360bbb-3(b)(1), unless the authorization is terminated  or revoked sooner.       Influenza A by PCR NEGATIVE NEGATIVE Final   Influenza B by PCR NEGATIVE NEGATIVE Final    Comment: (NOTE) The Xpert Xpress SARS-CoV-2/FLU/RSV plus assay is intended as an aid in  the diagnosis of influenza from Nasopharyngeal swab specimens and should not be used as a sole basis for treatment. Nasal washings and aspirates are unacceptable for Xpert Xpress SARS-CoV-2/FLU/RSV testing.  Fact Sheet for Patients: BloggerCourse.com  Fact Sheet for Healthcare Providers: SeriousBroker.it  This test is not yet approved or cleared by the [place] FDA and has been authorized for detection and/or diagnosis of SARS-CoV-2 by FDA under an Emergency Use Authorization (EUA). This EUA will remain in effect (meaning this test can be used) for the duration of the COVID-19 declaration under Section 564(b)(1) of the Act, 21 U.S.C. section 360bbb-3(b)(1), unless the authorization is terminated or revoked.  Performed at [facility], [address] [place], [place] [address]   Blood culture (single)     Status: None   Collection Time: 09/28/20  6:12 PM   Specimen: BLOOD  Result Value Ref Range Status   Specimen Description BLOOD BLOOD RIGHT FOREARM  Final   Special Requests   Final    BOTTLES DRAWN AEROBIC AND ANAEROBIC Blood Culture  adequate volume   Culture   Final    NO GROWTH 5 DAYS Performed at [facility], [address], [place], [place] [address]    Report Status 10/03/2020 FINAL  Final  Blood culture (routine single)     Status: Abnormal   Collection Time: 09/28/20  6:16 PM   Specimen: BLOOD  Result Value Ref Range Status   Specimen Description   Final    BLOOD BLOOD LEFT HAND Performed at [facility], [address], [place], [place] [address]    Special Requests   Final    BOTTLES DRAWN AEROBIC AND ANAEROBIC Blood Culture results may not be optimal due to an inadequate volume of blood received in culture bottles Performed at [facility], [address] [place], [place] [address]    Culture  Setup Time   Final    GRAM POSITIVE COCCI IN BOTH AEROBIC AND ANAEROBIC BOTTLES CRITICAL RESULT CALLED TO, READ BACK BY AND VERIFIED WITH: KISHAN PATEL ON 09/29/20 AT 1317 QSD    Culture (A)  Final    STAPHYLOCOCCUS EPIDERMIDIS STAPHYLOCOCCUS HOMINIS THE SIGNIFICANCE OF ISOLATING THIS ORGANISM FROM A SINGLE SET OF BLOOD CULTURES WHEN MULTIPLE SETS ARE DRAWN IS UNCERTAIN. PLEASE NOTIFY THE MICROBIOLOGY DEPARTMENT WITHIN ONE WEEK IF SPECIATION AND SENSITIVITIES ARE REQUIRED. Performed at [facility], [address], [place], [place] [address]    Report Status 10/01/2020 FINAL  Final  Urine culture     Status: Abnormal   Collection Time: 09/28/20  6:16 PM   Specimen: Urine, Random  Result Value Ref Range Status   Specimen Description   Final    URINE, RANDOM Performed at [facility], [address], [place], [place] [address]    Special Requests   Final    NONE Performed at [facility], [address] [place], [place] [address]    Culture (A)  Final    >=100,000 COLONIES/mL LACTOBACILLUS SPECIES Standardized susceptibility testing for this organism is not available. 10,000 COLONIES/mL ESCHERICHIA COLI    Report Status 10/02/2020 FINAL  Final    Organism ID, Bacteria ESCHERICHIA COLI (A)  Final      Susceptibility   Escherichia coli - MIC*    AMPICILLIN <=2 SENSITIVE Sensitive     CEFAZOLIN <=4 SENSITIVE Sensitive     CEFEPIME <=0.12 SENSITIVE Sensitive     CEFTRIAXONE <=0.25 SENSITIVE Sensitive     CIPROFLOXACIN <=0.25 SENSITIVE  Sensitive     GENTAMICIN <=1 SENSITIVE Sensitive     IMIPENEM <=0.25 SENSITIVE Sensitive     NITROFURANTOIN <=16 SENSITIVE Sensitive     TRIMETH/SULFA <=20 SENSITIVE Sensitive     AMPICILLIN/SULBACTAM <=2 SENSITIVE Sensitive     PIP/TAZO <=4 SENSITIVE Sensitive     * 10,000 COLONIES/mL ESCHERICHIA COLI  Blood Culture ID Panel (Reflexed)     Status: Abnormal   Collection Time: 09/28/20  6:16 PM  Result Value Ref Range Status   Enterococcus faecalis NOT DETECTED NOT DETECTED Final   Enterococcus Faecium NOT DETECTED NOT DETECTED Final   Listeria monocytogenes NOT DETECTED NOT DETECTED Final   Staphylococcus species DETECTED (A) NOT DETECTED Final    Comment: CRITICAL RESULT CALLED TO, READ BACK BY AND VERIFIED WITH: KISHAN PATEL ON 09/29/20 AT 1317 QSD    Staphylococcus aureus (BCID) NOT DETECTED NOT DETECTED Final   Staphylococcus epidermidis DETECTED (A) NOT DETECTED Final    Comment: CRITICAL RESULT CALLED TO, READ BACK BY AND VERIFIED WITH: KISHAN PATEL ON 09/29/20 AT 1317 QSD    Staphylococcus lugdunensis NOT DETECTED NOT DETECTED Final   Streptococcus species NOT DETECTED NOT DETECTED Final   Streptococcus agalactiae NOT DETECTED NOT DETECTED Final   Streptococcus pneumoniae NOT DETECTED NOT DETECTED Final   Streptococcus pyogenes NOT DETECTED NOT DETECTED Final   A.calcoaceticus-baumannii NOT DETECTED NOT DETECTED Final   Bacteroides fragilis NOT DETECTED NOT DETECTED Final   Enterobacterales NOT DETECTED NOT DETECTED Final   Enterobacter cloacae complex NOT DETECTED NOT DETECTED Final   Escherichia coli NOT DETECTED NOT DETECTED Final   Klebsiella aerogenes NOT DETECTED NOT DETECTED  Final   Klebsiella oxytoca NOT DETECTED NOT DETECTED Final   Klebsiella pneumoniae NOT DETECTED NOT DETECTED Final   Proteus species NOT DETECTED NOT DETECTED Final   Salmonella species NOT DETECTED NOT DETECTED Final   Serratia marcescens NOT DETECTED NOT DETECTED Final   Haemophilus influenzae NOT DETECTED NOT DETECTED Final   Neisseria meningitidis NOT DETECTED NOT DETECTED Final   Pseudomonas aeruginosa NOT DETECTED NOT DETECTED Final   Stenotrophomonas maltophilia NOT DETECTED NOT DETECTED Final   Candida albicans NOT DETECTED NOT DETECTED Final   Candida auris NOT DETECTED NOT DETECTED Final   Candida glabrata NOT DETECTED NOT DETECTED Final   Candida krusei NOT DETECTED NOT DETECTED Final   Candida parapsilosis NOT DETECTED NOT DETECTED Final   Candida tropicalis NOT DETECTED NOT DETECTED Final   Cryptococcus neoformans/gattii NOT DETECTED NOT DETECTED Final   Methicillin resistance mecA/C NOT DETECTED NOT DETECTED Final    Comment: Performed at [facility], [address] [place], [place] [address]  CULTURE, BLOOD (ROUTINE X 2) w Reflex to ID Panel     Status: None (Preliminary result)   Collection Time: 09/30/20  4:13 PM   Specimen: BLOOD  Result Value Ref Range Status   Specimen Description BLOOD LEFT ANTECUBITAL  Final   Special Requests   Final    BOTTLES DRAWN AEROBIC AND ANAEROBIC Blood Culture adequate volume   Culture   Final    NO GROWTH 3 DAYS Performed at [facility], [address] [place], [place] [address]    Report Status PENDING  Incomplete  CULTURE, BLOOD (ROUTINE X 2) w Reflex to ID Panel     Status: None (Preliminary result)   Collection Time: 09/30/20  4:13 PM   Specimen: BLOOD  Result Value Ref Range Status   Specimen Description BLOOD BLOOD LEFT HAND  Final   Special  Requests   Final    BOTTLES DRAWN AEROBIC AND ANAEROBIC Blood Culture adequate volume   Culture   Final    NO GROWTH 3 DAYS Performed at [facility],  [address] [place], [place] [address]    Report Status PENDING  Incomplete         Radiology Studies: No results found.      Scheduled Meds: . apixaban  2.5 mg Oral BID  . donepezil  10 mg Oral Daily  . feeding supplement  237 mL Oral BID BM  . metoprolol succinate  25 mg Oral BID  . midodrine  5 mg Oral TID WC  . multivitamin-lutein  1 capsule Oral Daily   Continuous Infusions: . sodium chloride 30 mL/hr at 10/02/20 1542  . cefTRIAXone (ROCEPHIN)  IV Stopped (10/02/20 2131)    Assessment & Plan:   Principal Problem:   Severe sepsis (HCC) Active Problems:   Benign essential hypertension   Chronic anemia   Mixed Alzheimer's and vascular dementia (HCC)   AKI (acute kidney injury) (HCC)   Sacral decubitus ulcer   Cellulitis   AF (paroxysmal atrial fibrillation) (HCC)   Chronic anticoagulation   Functional quadriplegia (HCC)   History of lacunar cerebrovascular accident   UTI (urinary tract infection)   80 year old female with history of paroxysmal A. fib on Eliquis, Alzheimer's disease and total care dependent, hypertension, hx of lacunar infarct and osteoarthritis sent from PCP with concern for sacral skin breakdown associated with decreased oral intake and generalized weakness.      Severe sepsis (HCC)   Cellulitis/Blisters/ infected decubitus left buttock   UTI  - Patient  meeting sepsis criteria including leukocytosis, hypotension, tachycardia, AKI and tachypnea - Sepsis source, suspect cellulitis/infected decubitus ulcer as well as UTI 1/21-afebrile, leukocytosis improved.  Ucx + ,for >100 lactobacillus, more than 10,000 E. Coli 1/2 bcx staph epi-and staph hominis likely contaminent-discussed with ID per Dr. Algis LimingVandam 1/22-lactic acid improved with ivf for hydration 1/23-discontinue IV fluids Blood cultures to date negative Continue ceftriaxone for now See wound care nursing note       AKI (acute kidney injury) (HCC) - Creatinine 2.15, up from  1.0 on 1/10 at doctor's office -  Likely related to combination of sepsis and decreased p.o. intake  1/23-improved with hydration and has remained stable  Avoid nephrotoxic medication  Encourage hydration          AF (paroxysmal atrial fibrillation) with RVR   Chronic anticoagulation - Ventricular response increase  likely secondary to sepsis/dehydration -had a run of SVT HR still not well controlled Cards consulted, input appreciated Toprol-XL increased to 50 mg twice daily of metoprolol Continue Eliquis     Benign essential hypertension Was on the soft side initially related to sepsis. Continue midodrine Continue beta blker, dose increased as above Continue beta-blockers       Chronic anemia - Hemoglobin 9.1 which is baseline Stool occult negative H&H stable continue to monitor periodically     Mixed Alzheimer's and vascular dementia (HCC)   Functional quadriplegia (HCC) - According to daughter, patient is total care dependent and needs assistance in all ADLs - Dysphagia 2 diet and nutritionist eval - TOC consulted. Daughter would be interested in skilled rehab but states her father might be less interested - Fall and aspiration precautions and increase nursing assistance SPL saw patient please see note    History of lacunar cerebrovascular accident -  On statin      DVT prophylaxis: Eliquis Code  Status: DNR Family Communication:spoke to daughter  Status is: Inpatient  Remains inpatient appropriate because:Inpatient level of care appropriate due to severity of illness   Dispo: The patient is from: Home              Anticipated d/c is to: SNF              Anticipated d/c date is:TBD              Patient currently is not medically stable to d/c. Heart rate still elevated, needs to be better controlled prior to discharge to SNF.  Daughter was interested in [facility]  SNF placement pending          LOS: 5 days   Time spent: 35 minutes  with more than 50% on COC    Lynn Ito, MD [facility] Pager [phone]  If 7PM-7AM, please contact night-coverage 10/03/2020, 8:42 AM

## 2020-10-03 NOTE — Plan of Care (Signed)
  Problem: Clinical Measurements: Goal: Will remain free from infection Outcome: Progressing   Problem: Clinical Measurements: Goal: Cardiovascular complication will be avoided Outcome: Progressing   Problem: Elimination: Goal: Will not experience complications related to urinary retention Outcome: Progressing   Problem: Safety: Goal: Ability to remain free from injury will improve Outcome: Progressing

## 2020-10-03 NOTE — Evaluation (Signed)
Occupational Therapy Evaluation Patient Details Name: Laurie Jackson MRN: [id number] DOB: 16-Aug-1941 Today's Date: 10/03/2020    History of Present Illness 80 y.o. female with medical history significant for Paroxysmal A. fib on Eliquis, Alzheimer's disease and total care dependent, hypertension, hx of lacunar infarct and osteoarthritis who presented to the emergency room on the advice of her PCP after she developed an area of skin breakdown on her buttock a week ago that has been rapidly progressing. Per daughter in the last few months pt has had even more signficicant decline with mobilit, using Hoyer lift instead of w/c with increasing difficulty managing her daily needs.   Clinical Impression   Laurie Jackson was seen for OT evaluation this date. Prior to hospital admission, pt was requiring assist from husband and daughter for [facility] t/f and ADLs. Pt presents to acute OT demonstrating impaired ADL performance and functional mobility 2/2 decreased activity tolerance, functional strength/ROM/balance deficits, and poor insight into deficits. Pt currently requires SETUP + MOD VCs self feeding/drinking/tooth brushing at bed level. MOD A sup>sit and initial MOD A sitting balance improving to intermittent CGA c BUE support for static sitting. MAX A for LBD at bed level. Pt would benefit from skilled OT to address noted impairments and functional limitations (see below for any additional details) in order to maximize safety and independence while minimizing falls risk and caregiver burden. Upon hospital discharge, recommend STR to maximize pt safety and return to PLOF.     Follow Up Recommendations  SNF    Equipment Recommendations  Other (comment) (TBD)    Recommendations for Other Services       Precautions / Restrictions Precautions Precautions: Fall Restrictions Weight Bearing Restrictions: No      Mobility Bed Mobility Overal bed mobility: Needs Assistance Bed Mobility: Supine to  Sit;Sit to Supine     Supine to sit: Mod assist Sit to supine: Max assist        Transfers                 General transfer comment: Deferred 2/2 high BP    Balance Overall balance assessment: Needs assistance Sitting-balance support: Bilateral upper extremity supported;Feet supported Sitting balance-Leahy Scale: Poor   Postural control: Posterior lean                                 ADL either performed or assessed with clinical judgement   ADL Overall ADL's : Needs assistance/impaired                                       General ADL Comments: SETUP + VCs self feeding/drinking/tooth brushing at bed level. MAX A hair br\ushing seated EOB - pt requires BUE support for static sitting. MAX A for LBD at bed level                  Pertinent Vitals/Pain Pain Assessment:  (unable to rate, indicates general arthritic pain with most LE movement)     Hand Dominance Right   Extremity/Trunk Assessment Upper Extremity Assessment Upper Extremity Assessment: Generalized weakness;Difficult to assess due to impaired cognition   Lower Extremity Assessment Lower Extremity Assessment: Generalized weakness;Difficult to assess due to impaired cognition       Communication Communication Communication: Expressive difficulties   Cognition Arousal/Alertness: Awake/alert Behavior During Therapy: Restless Overall Cognitive Status: History  of cognitive impairments - at baseline                                     General Comments       Exercises Exercises: Other exercises Other Exercises Other Exercises: Pt and family educated re: OT role, DME recs, d/c recs, falls prevention, ECS, adapted dining equipment recs Other Exercises: LBD, self-feeding/drinking, sup<>sit, sitting balance/toelrance   Shoulder Instructions      Home Living Family/patient expects to be discharged to:: Skilled nursing facility                                         Prior Functioning/Environment Level of Independence: Needs assistance  Gait / Transfers Assistance Needed: SPT bed<>BSC<>chair - family assists ADL's / Homemaking Assistance Needed: SETUP feeding, assist bathing/dressing            OT Problem List: Decreased strength;Decreased range of motion;Decreased activity tolerance;Impaired balance (sitting and/or standing);Decreased safety awareness      OT Treatment/Interventions: Self-care/ADL training;Therapeutic exercise;DME and/or AE instruction;Energy conservation;Therapeutic activities;Patient/family education;Balance training    OT Goals(Current goals can be found in the care plan section) Acute Rehab OT Goals Patient Stated Goal: To go to rehab OT Goal Formulation: With patient/family Time For Goal Achievement: 10/17/20 Potential to Achieve Goals: Good ADL Goals Pt Will Perform Eating: with set-up;with supervision;bed level Pt Will Perform Grooming: with min assist;sitting Pt Will Perform Upper Body Bathing: with min assist;sitting Pt Will Transfer to Toilet: with min assist (rolling at bed level)  OT Frequency: Min 1X/week   Barriers to D/C: Inaccessible home environment             AM-PAC OT "6 Clicks" Daily Activity     Outcome Measure Help from another person eating meals?: A Little Help from another person taking care of personal grooming?: A Little Help from another person toileting, which includes using toliet, bedpan, or urinal?: A Lot Help from another person bathing (including washing, rinsing, drying)?: A Lot Help from another person to put on and taking off regular upper body clothing?: A Lot Help from another person to put on and taking off regular lower body clothing?: A Lot 6 Click Score: 14   End of Session    Activity Tolerance: Patient tolerated treatment well Patient left: in bed;with call bell/phone within reach;with bed alarm set;with family/visitor present  OT  Visit Diagnosis: Other abnormalities of gait and mobility (R26.89);Muscle weakness (generalized) (M62.81)                Time: 1478-2956 OT Time Calculation (min): 37 min Charges:  OT General Charges $OT Visit: 1 Visit OT Evaluation $OT Eval Moderate Complexity: 1 Mod OT Treatments $Self Care/Home Management : 23-37 mins  Kathie Dike, M.S. OTR/L  10/03/20, 1:13 PM  ascom [phone]

## 2020-10-03 NOTE — Consult Note (Signed)
[facility] Cardiology Consultation Note  Patient ID: Laurie Jackson, MRN: [id number], DOB/AGE: Oct 25, 1940 80 y.o. Admit date: 09/28/2020   Date of Consult: 10/03/2020 Primary Physician: Marguarite Arbour, MD Primary Cardiologist: [facility]  Chief Complaint:  Chief Complaint  Patient presents with  . Wound Check    Possible decub    Reason for Consult: HPI    Wound Check     Additional comments: Possible decub        Last edited by Lucianne Muss, RN on 09/28/2020  6:22 PM. (History)    Atrial fibrillation  HPI: 80 y.o. female with known dementia hypertension hyperlipidemia and previous stroke with paroxysmal nonvalvular atrial fibrillation.  The patient has been nonambulatory for which has been a big issue with a significant decubitus ulcer.  This has been infected and was needing antibiotics for which the patient has received.  In addition to that the patient has had atrial fibrillation which appears by EKG to be more persistent at this time with a heart rate of 103 bpm.  Metoprolol has been used for reasonable heart rate control and there has been other concerns of bradycardia but no evidence of clear sick sinus syndrome.  The patient therefore has been on anticoagulation as well which has not caused any bleeding or bruising complications.  Currently the patient is receiving appropriate therapy for her decubitus ulcer and otherwise has no angina congestive heart failure or myocardial infarction  Past Medical History:  Diagnosis Date  . Dementia (HCC)   . HTN (hypertension)   . OA (osteoarthritis)   . Paroxysmal A-fib [facility])       Surgical History:  Past Surgical History:  Procedure Laterality Date  . ABDOMINAL HYSTERECTOMY       Home Meds: Prior to Admission medications   Medication Sig Start Date End Date Taking? Authorizing Provider  apixaban (ELIQUIS) 2.5 MG TABS tablet TAKE 1 TABLET (2.5 MG TOTAL) BY MOUTH EVERY 12 (TWELVE) HOURS 08/02/20  Yes [provider]  ferrous sulfate 325 (65 FE) MG tablet Take 325 mg by mouth 2 (two) times daily. 07/14/20  Yes [provider]  losartan-hydrochlorothiazide (HYZAAR) 100-25 MG tablet Take 0.5 tablets by mouth daily. 12/29/19  Yes [provider]  megestrol (MEGACE) 40 MG/ML suspension Take 10 mLs by mouth daily. 12/02/19 12/01/20 Yes [provider]  Multiple Vitamin (MULTI-VITAMIN) tablet Take 1 tablet by mouth daily.   Yes [provider]  diclofenac Sodium (VOLTAREN) 1 % GEL Apply 2 g topically 4 (four) times daily. 08/30/20   Sharman Cheek, MD  donepezil (ARICEPT) 10 MG tablet Take 10 mg by mouth daily. 09/20/20   [provider]  propranolol (INDERAL) 20 MG tablet Take 20 mg by mouth daily. 07/10/20   [provider]    Inpatient Medications:  . apixaban  2.5 mg Oral BID  . donepezil  10 mg Oral Daily  . feeding supplement  237 mL Oral BID BM  . metoprolol succinate  25 mg Oral BID  . midodrine  5 mg Oral TID WC  . multivitamin-lutein  1 capsule Oral Daily   . cefTRIAXone (ROCEPHIN)  IV Stopped (10/02/20 2131)    Allergies:  Allergies  Allergen Reactions  . Penicillin G Other (See Comments)    Drunk feeling/weak    Social History   Socioeconomic History  . Marital status: Married    Spouse name: Not on file  . Number of children: Not on file  . Years of education: Not  on file  . Highest education level: Not on file  Occupational History  . Not on file  Tobacco Use  . Smoking status: Never Smoker  . Smokeless tobacco: Never Used  Substance and Sexual Activity  . Alcohol use: Never  . Drug use: Not on file  . Sexual activity: Not on file  Other Topics Concern  . Not on file  Social History Narrative  . Not on file   Social Determinants of Health   Financial Resource Strain: Not on file  Food Insecurity: Not on file  Transportation Needs: Not on file  Physical Activity: Not on file  Stress: Not on file  Social  Connections: Not on file  Intimate Partner Violence: Not on file     Family History  Problem Relation Age of Onset  . Breast cancer Neg Hx      Review of Systems Positive for weakness Negative for: General:  chills, fever, night sweats or weight changes.  Cardiovascular: PND orthopnea syncope dizziness  Dermatological skin lesions rashes Respiratory: Cough congestion Urologic: Frequent urination urination at night and hematuria Abdominal: negative for nausea, vomiting, diarrhea, bright red blood per rectum, melena, or hematemesis Neurologic: negative for visual changes, and/or hearing changes  All other systems reviewed and are otherwise negative except as noted above.  Labs: No results for input(s): CKTOTAL, CKMB, TROPONINI in the last 72 hours. Lab Results  Component Value Date   WBC 8.7 10/01/2020   HGB 8.7 (L) 10/01/2020   HCT 26.7 (L) 10/01/2020   MCV 85.9 10/01/2020   PLT 518 (H) 10/01/2020    Recent Labs  Lab 09/28/20 1811 09/29/20 0652 10/02/20 0926  NA 143   < > 142  K 4.0   < > 3.5  CL 105   < > 112*  CO2 23   < > 20*  BUN 48*   < > 16  CREATININE 2.15*   < > 0.77  CALCIUM 9.7   < > 8.4*  PROT 7.2  --   --   BILITOT 0.9  --   --   ALKPHOS 43  --   --   ALT 17  --   --   AST 17  --   --   GLUCOSE 115*   < > 95   < > = values in this interval not displayed.   No results found for: CHOL, HDL, LDLCALC, TRIG No results found for: DDIMER  Radiology/Studies:  DG Chest Port 1 View  Result Date: 09/28/2020 CLINICAL DATA:  Pressure wounds EXAM: PORTABLE CHEST 1 VIEW COMPARISON:  07/23/2014 FINDINGS: The heart size and mediastinal contours are within normal limits. Both lungs are clear. The visualized skeletal structures are unremarkable. IMPRESSION: No active disease. Electronically Signed   By: Jasmine Pang M.D.   On: 09/28/2020 19:22    EKG: Atrial fibrillation with controlled ventricular rate and nonspecific ST changes  Weights: Filed Weights    10/01/20 0358 10/02/20 0359 10/03/20 0417  Weight: 57.4 kg 54.5 kg 55.6 kg     Physical Exam: Blood pressure (!) 142/103, pulse 75, temperature 98.6 F (37 C), temperature source Oral, resp. rate 16, height 5\' 7"  (1.702 m), weight 55.6 kg, SpO2 97 %. Body mass index is 19.2 kg/m. General: Well developed, well nourished, in no acute distress. Head eyes ears nose throat: Normocephalic, atraumatic, sclera non-icteric, no xanthomas, nares are without discharge. No apparent thyromegaly and/or mass  Lungs: Normal respiratory effort.  no wheezes, no rales, no rhonchi.  Heart: Irregular with normal S1 S2. no murmur gallop, no rub, PMI is normal size and placement, carotid upstroke normal without bruit, jugular venous pressure is normal Abdomen: Soft, non-tender, non-distended with normoactive bowel sounds. No hepatomegaly. No rebound/guarding. No obvious abdominal masses. Abdominal aorta is normal size without bruit Extremities: Trace edema. no cyanosis, no clubbing, positive ulcers  Peripheral : 2+ bilateral upper extremity pulses, 2+ bilateral femoral pulses, 2+ bilateral dorsal pedal pulse Neuro: Alert and not oriented. No facial asymmetry. No focal deficit. Moves all extremities spontaneously. Musculoskeletal: Normal muscle tone without kyphosis Psych:  Responds to questions appropriately with a normal affect.    Assessment: 80 year old female with dementia nonambulatory with hypertension hyperlipidemia previous stroke and paroxysmal nonvalvular atrial fibrillation now more persistent with reasonable heart rate control and no current evidence of congestive heart failure or acute coronary syndrome  Plan: 1.  No further intervention from a cardiovascular standpoint with previous echocardiogram showing normal LV systolic function and no evidence of significant valvular heart disease 2.  Continue metoprolol for heart rate control with goal heart rate between 60 and 90 bpm 3.  Continue  anticoagulation for further risk reduction and stroke with atrial fibrillation current dose 4.  Continue supportive care and treatment decubitus ulcer and other significant concerns without restriction  Signed, Lamar Blinks M.D. [facility] Card[facility] 10/03/2020, 1:35 PM

## 2020-10-04 DIAGNOSIS — B957 Other staphylococcus as the cause of diseases classified elsewhere: Secondary | ICD-10-CM | POA: Diagnosis not present

## 2020-10-04 DIAGNOSIS — R7881 Bacteremia: Secondary | ICD-10-CM | POA: Diagnosis not present

## 2020-10-04 DIAGNOSIS — I4891 Unspecified atrial fibrillation: Secondary | ICD-10-CM

## 2020-10-04 DIAGNOSIS — F0390 Unspecified dementia without behavioral disturbance: Secondary | ICD-10-CM

## 2020-10-04 DIAGNOSIS — L89152 Pressure ulcer of sacral region, stage 2: Secondary | ICD-10-CM | POA: Diagnosis not present

## 2020-10-04 DIAGNOSIS — Z1629 Resistance to other single specified antibiotic: Secondary | ICD-10-CM | POA: Diagnosis not present

## 2020-10-04 LAB — CBC
HCT: 24.6 % — ABNORMAL LOW (ref 36.0–46.0)
Hemoglobin: 8.2 g/dL — ABNORMAL LOW (ref 12.0–15.0)
MCH: 28.3 pg (ref 26.0–34.0)
MCHC: 33.3 g/dL (ref 30.0–36.0)
MCV: 84.8 fL (ref 80.0–100.0)
Platelets: 416 10*3/uL — ABNORMAL HIGH (ref 150–400)
RBC: 2.9 MIL/uL — ABNORMAL LOW (ref 3.87–5.11)
RDW: 14.7 % (ref 11.5–15.5)
WBC: 7.3 10*3/uL (ref 4.0–10.5)
nRBC: 0.3 % — ABNORMAL HIGH (ref 0.0–0.2)

## 2020-10-04 NOTE — Plan of Care (Signed)
  Problem: Clinical Measurements: Goal: Will remain free from infection Outcome: Progressing   Problem: Clinical Measurements: Goal: Cardiovascular complication will be avoided Outcome: Progressing    Problem: Safety: Goal: Ability to remain free from injury will improve Outcome: Progressing   Problem: Skin Integrity: Goal: Risk for impaired skin integrity will decrease Outcome: Progressing   

## 2020-10-04 NOTE — Consult Note (Signed)
NAME: Laurie Jackson  DOB: 17-Mar-1941  MRN: [id number]  Date/Time: 10/04/2020 3:49 PM  REQUESTING PROVIDER: Dr.Amery Subjective:  REASON FOR CONSULT: bacteremia ?No history  available from patient. Chart reviewed Laurie Jackson is a 80 y.o. female with a history of dementia, paroxysmal A. fib on Eliquis hypertension, history of lacunar infarct presented to the emergency department on 09/28/2020 on the advice of her PCP because of skin breakdown on her buttock a week ago that has been progressing.  As per the admitting hospitalist note patient's daughter was the one who gave the history.  She has been declining over the past 2 months and needs assistance for all activities of daily living including feeding grooming and transfers.  Daughter provides full-time care for her mother.  The past 2 weeks mom has been spending most of the time in bed.  She is unable to participate in physical therapy.  They noted blisters over the sacral area and PCP asked her to go to the emergency department. The ED temperature was 99.5, BP 96/70, heart rate 108 and sats of 94% on room air.  She was given fluid bolus.  WBC was 14,000, lactate 1.6.  Creatinine was 2.15.  Hemoglobin 10.6.  EKG showed A. fib with RVR at 110.  Chest x-ray was clear.  Patient was started on IV vancomycin and ceftriaxone after cultures were sent. Blood culture came back positive for staph epidermidis In 1 set both aerobic and anaerobic bottle.  There was staff hominis present as well I am asked to see the patient. Patient is currently on vancomycin.  Past Medical History:  Diagnosis Date  . Dementia (HCC)   . HTN (hypertension)   . OA (osteoarthritis)   . Paroxysmal A-fib [facility])     Past Surgical History:  Procedure Laterality Date  . ABDOMINAL HYSTERECTOMY      Social History   Socioeconomic History  . Marital status: Married    Spouse name: Not on file  . Number of children: Not on file  . Years of education: Not on file  .  Highest education level: Not on file  Occupational History  . Not on file  Tobacco Use  . Smoking status: Never Smoker  . Smokeless tobacco: Never Used  Substance and Sexual Activity  . Alcohol use: Never  . Drug use: Not on file  . Sexual activity: Not on file  Other Topics Concern  . Not on file  Social History Narrative  . Not on file   Social Determinants of Health   Financial Resource Strain: Not on file  Food Insecurity: Not on file  Transportation Needs: Not on file  Physical Activity: Not on file  Stress: Not on file  Social Connections: Not on file  Intimate Partner Violence: Not on file    Family History  Problem Relation Age of Onset  . Breast cancer Neg Hx    Allergies  Allergen Reactions  . Penicillin G Other (See Comments)    Drunk feeling/weak   ID  Recent  Procedure Surgery Injections Trauma Sick contacts Travel Antibiotic use Food- raw/exotic Steroid/immune suppressants/splenectomy/Hardware Animal bites Tick exposure Water sports Fishing/hunting/animal bird exposure ? Current Facility-Administered Medications  Medication Dose Route Frequency Provider Last Rate Last Admin  . 0.9 %  sodium chloride infusion   Intravenous PRN Lynn Ito, MD   Stopped at 10/03/20 2155  . acetaminophen (TYLENOL) tablet 650 mg  650 mg Oral Q6H PRN Andris Baumann, MD       Or  .  acetaminophen (TYLENOL) suppository 650 mg  650 mg Rectal Q6H PRN Andris Baumannuncan, Hazel V, MD      . apixaban Everlene Balls(ELIQUIS) tablet 2.5 mg  2.5 mg Oral BID Lindajo Royaluncan, Hazel V, MD   2.5 mg at 10/04/20 1016  . cefTRIAXone (ROCEPHIN) 2 g in sodium chloride 0.9 % 100 mL IVPB  2 g Intravenous Q24H Andris Baumannuncan, Hazel V, MD   Stopped at 10/03/20 2055  . donepezil (ARICEPT) tablet 10 mg  10 mg Oral Daily Andris Baumannuncan, Hazel V, MD   10 mg at 10/04/20 1016  . feeding supplement (ENSURE ENLIVE / ENSURE PLUS) liquid 237 mL  237 mL Oral BID BM Lynn ItoAmery, Sahar, MD   237 mL at 10/04/20 1510  . metoprolol tartrate (LOPRESSOR)  tablet 50 mg  50 mg Oral BID Lamar BlinksKowalski, Bruce J, MD   50 mg at 10/04/20 1015  . midodrine (PROAMATINE) tablet 5 mg  5 mg Oral TID WC Lynn ItoAmery, Sahar, MD   5 mg at 10/03/20 1028  . multivitamin-lutein (OCUVITE-LUTEIN) capsule 1 capsule  1 capsule Oral Daily Lynn ItoAmery, Sahar, MD   1 capsule at 10/04/20 1021  . ondansetron (ZOFRAN) tablet 4 mg  4 mg Oral Q6H PRN Andris Baumannuncan, Hazel V, MD       Or  . ondansetron [facility](ZOFRAN) injection 4 mg  4 mg Intravenous Q6H PRN Andris Baumannuncan, Hazel V, MD      . senna-docusate (Senokot-S) tablet 1 tablet  1 tablet Oral QHS PRN Andris Baumannuncan, Hazel V, MD         Abtx:  Anti-infectives (From admission, onward)   Start     Dose/Rate Route Frequency Ordered Stop   09/30/20 1100  vancomycin (VANCOREADY) IVPB 750 mg/150 mL  Status:  Discontinued        750 mg 150 mL/hr over 60 Minutes Intravenous Every 36 hours 09/29/20 1226 09/29/20 1402   09/28/20 2030  vancomycin (VANCOCIN) IVPB 1000 mg/200 mL premix  Status:  Discontinued        1,000 mg 200 mL/hr over 60 Minutes Intravenous  Once 09/28/20 2017 09/28/20 2020   09/28/20 2030  cefTRIAXone (ROCEPHIN) 2 g in sodium chloride 0.9 % 100 mL IVPB        2 g 200 mL/hr over 30 Minutes Intravenous Every 24 hours 09/28/20 2017     09/28/20 1917  vancomycin variable dose per unstable renal function (pharmacist dosing)  Status:  Discontinued         Does not apply See admin instructions 09/28/20 1917 09/29/20 1240   09/28/20 1845  levofloxacin (LEVAQUIN) IVPB 750 mg        750 mg 100 mL/hr over 90 Minutes Intravenous  Once 09/28/20 1842 09/28/20 2100   09/28/20 1830  vancomycin (VANCOCIN) IVPB 1000 mg/200 mL premix        1,000 mg 200 mL/hr over 60 Minutes Intravenous  Once 09/28/20 1827 09/28/20 2311      REVIEW OF SYSTEMS:  NA Objective:  VITALS:  BP (!) 116/92 (BP Location: Left Arm)   Pulse 81   Temp 97.6 F (36.4 C) (Oral)   Resp 18   Ht 5\' 7"  (1.702 m)   Wt 55.4 kg   SpO2 100%   BMI 19.12 kg/m  PHYSICAL EXAM:  General: Patient  awake, no distress, responds to simple commands but minimally verbal. Head: Normocephalic, without obvious abnormality, atraumatic. Eyes: Conjunctivae clear, anicteric sclerae. Pupils are equal ENT Nares normal. No drainage or sinus tenderness. Lips, mucosa, and tongue normal. No Thrush Neck: Supple, symmetrical,  no adenopathy, thyroid: non tender no carotid bruit and no JVD. Back: Stage II sacral wound.  Does not look infected.    Lungs: Bilateral air entry. Heart: Irregular Abdomen: Soft, non-tender,not distended. Bowel sounds normal. No masses Extremities: atraumatic, no cyanosis. No edema. No clubbing Skin: Right cubital area deroofed blister Lymph: Cervical, supraclavicular normal. Neurologic: Could not examine in detail. Pertinent Labs Lab Results CBC    Component Value Date/Time   WBC 7.3 10/04/2020 0526   RBC 2.90 (L) 10/04/2020 0526   HGB 8.2 (L) 10/04/2020 0526   HGB 11.0 (L) 07/23/2014 1026   HCT 24.6 (L) 10/04/2020 0526   HCT 34.1 (L) 07/23/2014 1026   PLT 416 (H) 10/04/2020 0526   PLT 313 07/23/2014 1026   MCV 84.8 10/04/2020 0526   MCV 81 07/23/2014 1026   MCH 28.3 10/04/2020 0526   MCHC 33.3 10/04/2020 0526   RDW 14.7 10/04/2020 0526   RDW 14.2 07/23/2014 1026   LYMPHSABS 3.4 09/28/2020 1811   MONOABS 0.9 09/28/2020 1811   EOSABS 0.0 09/28/2020 1811   BASOSABS 0.1 09/28/2020 1811    CMP Latest Ref Rng & Units 10/02/2020 10/01/2020 09/30/2020  Glucose 70 - 99 mg/dL 95 93 93  BUN 8 - 23 mg/dL 16 23 16(X)  Creatinine 0.44 - 1.00 mg/dL 0.96 0.45 4.09  Sodium 135 - 145 mmol/L 142 139 140  Potassium 3.5 - 5.1 mmol/L 3.5 3.3(L) 3.5  Chloride 98 - 111 mmol/L 112(H) 107 107  CO2 22 - 32 mmol/L 20(L) 19(L) 22  Calcium 8.9 - 10.3 mg/dL 8.1(X) 9.1(Y) 7.8(G)  Total Protein 6.5 - 8.1 g/dL - - -  Total Bilirubin 0.3 - 1.2 mg/dL - - -  Alkaline Phos 38 - 126 U/L - - -  AST 15 - 41 U/L - - -  ALT 0 - 44 U/L - - -      Microbiology: Recent Results (from the  past 240 hour(s))  Resp Panel by RT-PCR (Flu A&B, Covid) Nasopharyngeal Swab     Status: None   Collection Time: 09/28/20  6:12 PM   Specimen: Nasopharyngeal Swab; Nasopharyngeal(NP) swabs in vial transport medium  Result Value Ref Range Status   SARS Coronavirus 2 by RT PCR NEGATIVE NEGATIVE Final    Comment: (NOTE) SARS-CoV-2 target nucleic acids are NOT DETECTED.  The SARS-CoV-2 RNA is generally detectable in upper respiratory specimens during the acute phase of infection. The lowest concentration of SARS-CoV-2 viral copies this assay can detect is 138 copies/mL. A negative result does not preclude SARS-Cov-2 infection and should not be used as the sole basis for treatment or other patient management decisions. A negative result may occur with  improper specimen collection/handling, submission of specimen other than nasopharyngeal swab, presence of viral mutation(s) within the areas targeted by this assay, and inadequate number of viral copies(<138 copies/mL). A negative result must be combined with clinical observations, patient history, and epidemiological information. The expected result is Negative.  Fact Sheet for Patients:  BloggerCourse.com  Fact Sheet for Healthcare Providers:  SeriousBroker.it  This test is no t yet approved or cleared by the [place] FDA and  has been authorized for detection and/or diagnosis of SARS-CoV-2 by FDA under an Emergency Use Authorization (EUA). This EUA will remain  in effect (meaning this test can be used) for the duration of the COVID-19 declaration under Section 564(b)(1) of the Act, 21 U.S.C.section 360bbb-3(b)(1), unless the authorization is terminated  or revoked sooner.  Influenza A by PCR NEGATIVE NEGATIVE Final   Influenza B by PCR NEGATIVE NEGATIVE Final    Comment: (NOTE) The Xpert Xpress SARS-CoV-2/FLU/RSV plus assay is intended as an aid in the diagnosis of  influenza from Nasopharyngeal swab specimens and should not be used as a sole basis for treatment. Nasal washings and aspirates are unacceptable for Xpert Xpress SARS-CoV-2/FLU/RSV testing.  Fact Sheet for Patients: BloggerCourse.comhttps://www.fda.gov/media/152166/download  Fact Sheet for Healthcare Providers: SeriousBroker.ithttps://www.fda.gov/media/152162/download  This test is not yet approved or cleared by the [place] FDA and has been authorized for detection and/or diagnosis of SARS-CoV-2 by FDA under an Emergency Use Authorization (EUA). This EUA will remain in effect (meaning this test can be used) for the duration of the COVID-19 declaration under Section 564(b)(1) of the Act, 21 U.S.C. section 360bbb-3(b)(1), unless the authorization is terminated or revoked.  Performed at [facility], [address] [place], [place] [address]   Blood culture (single)     Status: None   Collection Time: 09/28/20  6:12 PM   Specimen: BLOOD  Result Value Ref Range Status   Specimen Description BLOOD BLOOD RIGHT FOREARM  Final   Special Requests   Final    BOTTLES DRAWN AEROBIC AND ANAEROBIC Blood Culture adequate volume   Culture   Final    NO GROWTH 5 DAYS Performed at [facility], [address] [place], [place] [address]    Report Status 10/03/2020 FINAL  Final  Blood culture (routine single)     Status: Abnormal   Collection Time: 09/28/20  6:16 PM   Specimen: BLOOD  Result Value Ref Range Status   Specimen Description   Final    BLOOD BLOOD LEFT HAND Performed at [facility], [address] [place], [place] [address]    Special Requests   Final    BOTTLES DRAWN AEROBIC AND ANAEROBIC Blood Culture results may not be optimal due to an inadequate volume of blood received in culture bottles Performed at [facility], [address] [place], [place] [address]    Culture  Setup Time   Final    GRAM POSITIVE COCCI IN BOTH AEROBIC AND ANAEROBIC  BOTTLES CRITICAL RESULT CALLED TO, READ BACK BY AND VERIFIED WITH: KISHAN PATEL ON 09/29/20 AT 1317 QSD    Culture (A)  Final    STAPHYLOCOCCUS EPIDERMIDIS STAPHYLOCOCCUS HOMINIS THE SIGNIFICANCE OF ISOLATING THIS ORGANISM FROM A SINGLE SET OF BLOOD CULTURES WHEN MULTIPLE SETS ARE DRAWN IS UNCERTAIN. PLEASE NOTIFY THE MICROBIOLOGY DEPARTMENT WITHIN ONE WEEK IF SPECIATION AND SENSITIVITIES ARE REQUIRED. Performed at [facility], [address] [place], [place] [address]    Report Status 10/01/2020 FINAL  Final  Urine culture     Status: Abnormal   Collection Time: 09/28/20  6:16 PM   Specimen: Urine, Random  Result Value Ref Range Status   Specimen Description   Final    URINE, RANDOM Performed at [facility], [address] [place], [place] [address]    Special Requests   Final    NONE Performed at [facility], [address] [place], [place] [address]    Culture (A)  Final    >=100,000 COLONIES/mL LACTOBACILLUS SPECIES Standardized susceptibility testing for this organism is not available. 10,000 COLONIES/mL ESCHERICHIA COLI    Report Status 10/02/2020 FINAL  Final   Organism ID, Bacteria ESCHERICHIA COLI (A)  Final      Susceptibility   Escherichia coli - MIC*    AMPICILLIN <=2  SENSITIVE Sensitive     CEFAZOLIN <=4 SENSITIVE Sensitive     CEFEPIME <=0.12 SENSITIVE Sensitive     CEFTRIAXONE <=0.25 SENSITIVE Sensitive     CIPROFLOXACIN <=0.25 SENSITIVE Sensitive     GENTAMICIN <=1 SENSITIVE Sensitive     IMIPENEM <=0.25 SENSITIVE Sensitive     NITROFURANTOIN <=16 SENSITIVE Sensitive     TRIMETH/SULFA <=20 SENSITIVE Sensitive     AMPICILLIN/SULBACTAM <=2 SENSITIVE Sensitive     PIP/TAZO <=4 SENSITIVE Sensitive     * 10,000 COLONIES/mL ESCHERICHIA COLI  Blood Culture ID Panel (Reflexed)     Status: Abnormal   Collection Time: 09/28/20  6:16 PM  Result Value Ref Range Status   Enterococcus faecalis NOT DETECTED NOT DETECTED Final    Enterococcus Faecium NOT DETECTED NOT DETECTED Final   Listeria monocytogenes NOT DETECTED NOT DETECTED Final   Staphylococcus species DETECTED (A) NOT DETECTED Final    Comment: CRITICAL RESULT CALLED TO, READ BACK BY AND VERIFIED WITH: KISHAN PATEL ON 09/29/20 AT 1317 QSD    Staphylococcus aureus (BCID) NOT DETECTED NOT DETECTED Final   Staphylococcus epidermidis DETECTED (A) NOT DETECTED Final    Comment: CRITICAL RESULT CALLED TO, READ BACK BY AND VERIFIED WITH: KISHAN PATEL ON 09/29/20 AT 1317 QSD    Staphylococcus lugdunensis NOT DETECTED NOT DETECTED Final   Streptococcus species NOT DETECTED NOT DETECTED Final   Streptococcus agalactiae NOT DETECTED NOT DETECTED Final   Streptococcus pneumoniae NOT DETECTED NOT DETECTED Final   Streptococcus pyogenes NOT DETECTED NOT DETECTED Final   A.calcoaceticus-baumannii NOT DETECTED NOT DETECTED Final   Bacteroides fragilis NOT DETECTED NOT DETECTED Final   Enterobacterales NOT DETECTED NOT DETECTED Final   Enterobacter cloacae complex NOT DETECTED NOT DETECTED Final   Escherichia coli NOT DETECTED NOT DETECTED Final   Klebsiella aerogenes NOT DETECTED NOT DETECTED Final   Klebsiella oxytoca NOT DETECTED NOT DETECTED Final   Klebsiella pneumoniae NOT DETECTED NOT DETECTED Final   Proteus species NOT DETECTED NOT DETECTED Final   Salmonella species NOT DETECTED NOT DETECTED Final   Serratia marcescens NOT DETECTED NOT DETECTED Final   Haemophilus influenzae NOT DETECTED NOT DETECTED Final   Neisseria meningitidis NOT DETECTED NOT DETECTED Final   Pseudomonas aeruginosa NOT DETECTED NOT DETECTED Final   Stenotrophomonas maltophilia NOT DETECTED NOT DETECTED Final   Candida albicans NOT DETECTED NOT DETECTED Final   Candida auris NOT DETECTED NOT DETECTED Final   Candida glabrata NOT DETECTED NOT DETECTED Final   Candida krusei NOT DETECTED NOT DETECTED Final   Candida parapsilosis NOT DETECTED NOT DETECTED Final   Candida tropicalis NOT  DETECTED NOT DETECTED Final   Cryptococcus neoformans/gattii NOT DETECTED NOT DETECTED Final   Methicillin resistance mecA/C NOT DETECTED NOT DETECTED Final    Comment: Performed at [facility], [address] [place], [place] [address]  CULTURE, BLOOD (ROUTINE X 2) w Reflex to ID Panel     Status: None (Preliminary result)   Collection Time: 09/30/20  4:13 PM   Specimen: BLOOD  Result Value Ref Range Status   Specimen Description BLOOD LEFT ANTECUBITAL  Final   Special Requests   Final    BOTTLES DRAWN AEROBIC AND ANAEROBIC Blood Culture adequate volume   Culture   Final    NO GROWTH 4 DAYS Performed at [facility], [address] [place], [place] [address]    Report Status PENDING  Incomplete  CULTURE, BLOOD (ROUTINE X 2) w Reflex to ID Panel     Status:  None (Preliminary result)   Collection Time: 09/30/20  4:13 PM   Specimen: BLOOD  Result Value Ref Range Status   Specimen Description BLOOD BLOOD LEFT HAND  Final   Special Requests   Final    BOTTLES DRAWN AEROBIC AND ANAEROBIC Blood Culture adequate volume   Culture   Final    NO GROWTH 4 DAYS Performed at [facility], [address] [place], [place] [address]    Report Status PENDING  Incomplete    IMAGING RESULTS:  I have personally reviewed the films ? Impression/Recommendation ?80 year old female with history of paroxysmal A. fib dementia and total care dependent, hypertension was hospitalized on 09/28/2020 with skin breakdown. Found to have borderline hypotension and low-grade fever  Stage II decubitus the sacral area. Does not look overtly infected. Patient is being treated like sepsis with IV fluids.  The cortisol that was sent on 09/29/2020 was 5.6 and it is low. We will need to check ACTH and repeat the cortisol on supplement.  Staph epidermidis and staph S in 1 set of blood culture. Very likely contaminant. Patient currently on ceftriaxone. Will change to cefazolin.  AKI  patient's creatinine was 2.15. Very likely prerenal and possible infection and it resolved with IV hydration. ? A. fib. Patient is on chronic anticoagulation. ?  Dementia  ___________________________________________________ Discussed the management with the care team. Note:  This document was prepared using Dragon voice recognition software and may include unintentional dictation errors.

## 2020-10-04 NOTE — Progress Notes (Signed)
PROGRESS NOTE    Laurie Jackson  ZOX:[id number] DOB: 06/16/1941 DOA: 09/28/2020 PCP: Marguarite ArbourSparks, Jeffrey D, MD    Brief Narrative:  Laurie ReeCarolyn Weekley is a 80 y.o. female with medical history significant for Paroxysmal A. fib on Eliquis, Alzheimer's disease and total care dependent, hypertension, hx of lacunar infarct and osteoarthritis who presented to the emergency room on the advice of her PCP after she developed an area of skin breakdown on her buttock a week ago that has been rapidly progressing.  Most of the history is taken from patient's daughter, Laurie Jackson due to her mother's dementia.  She states that her mother has been rapidly declining over the past 2 months and currently she needs assistance for all activities of daily living including feeding, grooming and transfers.  Daughter has moved to live with her parents to assist her father and providing full-time care to her mother .  She states that over the past couple weeks her mother has  been very weak and has been spending most of the time in the bed. Physical therapy was ordered however patient was unable to participate with them.  Over the past week they noticed the area of skin blistering and was thus advised by the PCP to present to the emergency room.  Found with A. fib RVR  1/19- afib rvr today  1/20-ms better today.  Hr improving  1/21-spoke to daughter, states moms MS much improved 85%.  HR better controlled.  1/22-no overnight issues. Elevated lactic acid improved with ivf.  1/23-no overnight issues. Daughter at bedside and reports good po intake but has to remind her to eat.had a run of svt, hr still elevated 1/24-HR low teens on tele  Consultants:     Procedures:   Antimicrobials:    ceftriaxone  Subjective: No sob, cp, or dizziness   Objective: Vitals:   10/04/20 1006 10/04/20 1130 10/04/20 1651 10/04/20 1817  BP: (!) 138/94 (!) 116/92 (!) 126/91 (!) 157/103  Pulse: 79 81 (!) 105 99  Resp:  18 18    Temp:  97.6 F (36.4 C) 97.7 F (36.5 C)   TempSrc:  Oral Oral   SpO2:  100% 95%   Weight:      Height:        Intake/Output Summary (Last 24 hours) at 10/04/2020 2042 Last data filed at 10/04/2020 1900 Gross per 24 hour  Intake 347 ml  Output 400 ml  Net -53 ml   Filed Weights   10/02/20 0359 10/03/20 0417 10/04/20 0344  Weight: 54.5 kg 55.6 kg 55.4 kg    Examination: Calm, nad cta no w/r/r Reg, irreg, s1s2 Soft benign +bs No edema At baseline ms, grossly intact      Data Reviewed: I have personally reviewed following labs and imaging studies  CBC: Recent Labs  Lab 09/28/20 1811 09/29/20 0652 10/01/20 0409 10/04/20 0526  WBC 14.2* 9.9 8.7 7.3  NEUTROABS 9.1*  --   --   --   HGB 10.6* 9.1* 8.7* 8.2*  HCT 32.4* 27.4* 26.7* 24.6*  MCV 86.4 85.6 85.9 84.8  PLT 629* 524* 518* 416*   Basic Metabolic Panel: Recent Labs  Lab 09/28/20 1811 09/29/20 0652 09/30/20 0607 10/01/20 0409 10/02/20 0926  NA 143 139 140 139 142  K 4.0 3.6 3.5 3.3* 3.5  CL 105 107 107 107 112*  CO2 23 21* 22 19* 20*  GLUCOSE 115* 99 93 93 95  BUN 48* 33* 25* 23 16  CREATININE 2.15* 1.17* 0.88 0.87  0.77  CALCIUM 9.7 8.8* 8.8* 8.8* 8.4*  MG  --   --   --  1.6*  --    GFR: Estimated Creatinine Clearance: 51.5 mL/min (by C-G formula based on SCr of 0.77 mg/dL). Liver Function Tests: Recent Labs  Lab 09/28/20 1811  AST 17  ALT 17  ALKPHOS 43  BILITOT 0.9  PROT 7.2  ALBUMIN 3.0*   No results for input(s): LIPASE, AMYLASE in the last 168 hours. No results for input(s): AMMONIA in the last 168 hours. Coagulation Profile: Recent Labs  Lab 09/28/20 1811 09/29/20 0652  INR 1.9* 1.8*   Cardiac Enzymes: No results for input(s): CKTOTAL, CKMB, CKMBINDEX, TROPONINI in the last 168 hours. BNP (last 3 results) No results for input(s): PROBNP in the last 8760 hours. HbA1C: No results for input(s): HGBA1C in the last 72 hours. CBG: Recent Labs  Lab 10/02/20 0811  10/02/20 1312 10/02/20 1806  GLUCAP 87 114* 89   Lipid Profile: No results for input(s): CHOL, HDL, LDLCALC, TRIG, CHOLHDL, LDLDIRECT in the last 72 hours. Thyroid Function Tests: No results for input(s): TSH, T4TOTAL, FREET4, T3FREE, THYROIDAB in the last 72 hours. Anemia Panel: No results for input(s): VITAMINB12, FOLATE, FERRITIN, TIBC, IRON, RETICCTPCT in the last 72 hours. Sepsis Labs: Recent Labs  Lab 09/29/20 0652 09/30/20 0923 10/01/20 0917 10/01/20 1150 10/02/20 0926 10/02/20 1133  PROCALCITON 0.14  --   --   --   --   --   LATICACIDVEN  --    < > 1.3 3.6* 1.2 1.6   < > = values in this interval not displayed.    Recent Results (from the past 240 hour(s))  Resp Panel by RT-PCR (Flu A&B, Covid) Nasopharyngeal Swab     Status: None   Collection Time: 09/28/20  6:12 PM   Specimen: Nasopharyngeal Swab; Nasopharyngeal(NP) swabs in vial transport medium  Result Value Ref Range Status   SARS Coronavirus 2 by RT PCR NEGATIVE NEGATIVE Final    Comment: (NOTE) SARS-CoV-2 target nucleic acids are NOT DETECTED.  The SARS-CoV-2 RNA is generally detectable in upper respiratory specimens during the acute phase of infection. The lowest concentration of SARS-CoV-2 viral copies this assay can detect is 138 copies/mL. A negative result does not preclude SARS-Cov-2 infection and should not be used as the sole basis for treatment or other patient management decisions. A negative result may occur with  improper specimen collection/handling, submission of specimen other than nasopharyngeal swab, presence of viral mutation(s) within the areas targeted by this assay, and inadequate number of viral copies(<138 copies/mL). A negative result must be combined with clinical observations, patient history, and epidemiological information. The expected result is Negative.  Fact Sheet for Patients:  BloggerCourse.com  Fact Sheet for Healthcare Providers:   SeriousBroker.it  This test is no t yet approved or cleared by the [place] FDA and  has been authorized for detection and/or diagnosis of SARS-CoV-2 by FDA under an Emergency Use Authorization (EUA). This EUA will remain  in effect (meaning this test can be used) for the duration of the COVID-19 declaration under Section 564(b)(1) of the Act, 21 U.S.C.section 360bbb-3(b)(1), unless the authorization is terminated  or revoked sooner.       Influenza A by PCR NEGATIVE NEGATIVE Final   Influenza B by PCR NEGATIVE NEGATIVE Final    Comment: (NOTE) The Xpert Xpress SARS-CoV-2/FLU/RSV plus assay is intended as an aid in the diagnosis of influenza from Nasopharyngeal swab specimens and should not be used as  a sole basis for treatment. Nasal washings and aspirates are unacceptable for Xpert Xpress SARS-CoV-2/FLU/RSV testing.  Fact Sheet for Patients: BloggerCourse.com  Fact Sheet for Healthcare Providers: SeriousBroker.it  This test is not yet approved or cleared by the [place] FDA and has been authorized for detection and/or diagnosis of SARS-CoV-2 by FDA under an Emergency Use Authorization (EUA). This EUA will remain in effect (meaning this test can be used) for the duration of the COVID-19 declaration under Section 564(b)(1) of the Act, 21 U.S.C. section 360bbb-3(b)(1), unless the authorization is terminated or revoked.  Performed at [facility], [address] [place], [place] [address]   Blood culture (single)     Status: None   Collection Time: 09/28/20  6:12 PM   Specimen: BLOOD  Result Value Ref Range Status   Specimen Description BLOOD BLOOD RIGHT FOREARM  Final   Special Requests   Final    BOTTLES DRAWN AEROBIC AND ANAEROBIC Blood Culture adequate volume   Culture   Final    NO GROWTH 5 DAYS Performed at [facility], [address], [place], [place]  [address]    Report Status 10/03/2020 FINAL  Final  Blood culture (routine single)     Status: Abnormal   Collection Time: 09/28/20  6:16 PM   Specimen: BLOOD  Result Value Ref Range Status   Specimen Description   Final    BLOOD BLOOD LEFT HAND Performed at [facility], [address], [place], [place] [address]    Special Requests   Final    BOTTLES DRAWN AEROBIC AND ANAEROBIC Blood Culture results may not be optimal due to an inadequate volume of blood received in culture bottles Performed at [facility], [address] [place], [place] [address]    Culture  Setup Time   Final    GRAM POSITIVE COCCI IN BOTH AEROBIC AND ANAEROBIC BOTTLES CRITICAL RESULT CALLED TO, READ BACK BY AND VERIFIED WITH: KISHAN PATEL ON 09/29/20 AT 1317 QSD    Culture (A)  Final    STAPHYLOCOCCUS EPIDERMIDIS STAPHYLOCOCCUS HOMINIS THE SIGNIFICANCE OF ISOLATING THIS ORGANISM FROM A SINGLE SET OF BLOOD CULTURES WHEN MULTIPLE SETS ARE DRAWN IS UNCERTAIN. PLEASE NOTIFY THE MICROBIOLOGY DEPARTMENT WITHIN ONE WEEK IF SPECIATION AND SENSITIVITIES ARE REQUIRED. Performed at [facility], [address] [place], [place] [address]    Report Status 10/01/2020 FINAL  Final  Urine culture     Status: Abnormal   Collection Time: 09/28/20  6:16 PM   Specimen: Urine, Random  Result Value Ref Range Status   Specimen Description   Final    URINE, RANDOM Performed at [facility], [address] [place], [place] [address]    Special Requests   Final    NONE Performed at [facility], [address] [place], [place] [address]    Culture (A)  Final    >=100,000 COLONIES/mL LACTOBACILLUS SPECIES Standardized susceptibility testing for this organism is not available. 10,000 COLONIES/mL ESCHERICHIA COLI    Report Status 10/02/2020 FINAL  Final   Organism ID, Bacteria ESCHERICHIA COLI (A)  Final      Susceptibility   Escherichia coli - MIC*    AMPICILLIN <=2 SENSITIVE  Sensitive     CEFAZOLIN <=4 SENSITIVE Sensitive     CEFEPIME <=0.12 SENSITIVE Sensitive     CEFTRIAXONE <=0.25 SENSITIVE Sensitive     CIPROFLOXACIN <=0.25 SENSITIVE Sensitive     GENTAMICIN <=1 SENSITIVE Sensitive     IMIPENEM <=  0.25 SENSITIVE Sensitive     NITROFURANTOIN <=16 SENSITIVE Sensitive     TRIMETH/SULFA <=20 SENSITIVE Sensitive     AMPICILLIN/SULBACTAM <=2 SENSITIVE Sensitive     PIP/TAZO <=4 SENSITIVE Sensitive     * 10,000 COLONIES/mL ESCHERICHIA COLI  Blood Culture ID Panel (Reflexed)     Status: Abnormal   Collection Time: 09/28/20  6:16 PM  Result Value Ref Range Status   Enterococcus faecalis NOT DETECTED NOT DETECTED Final   Enterococcus Faecium NOT DETECTED NOT DETECTED Final   Listeria monocytogenes NOT DETECTED NOT DETECTED Final   Staphylococcus species DETECTED (A) NOT DETECTED Final    Comment: CRITICAL RESULT CALLED TO, READ BACK BY AND VERIFIED WITH: KISHAN PATEL ON 09/29/20 AT 1317 QSD    Staphylococcus aureus (BCID) NOT DETECTED NOT DETECTED Final   Staphylococcus epidermidis DETECTED (A) NOT DETECTED Final    Comment: CRITICAL RESULT CALLED TO, READ BACK BY AND VERIFIED WITH: KISHAN PATEL ON 09/29/20 AT 1317 QSD    Staphylococcus lugdunensis NOT DETECTED NOT DETECTED Final   Streptococcus species NOT DETECTED NOT DETECTED Final   Streptococcus agalactiae NOT DETECTED NOT DETECTED Final   Streptococcus pneumoniae NOT DETECTED NOT DETECTED Final   Streptococcus pyogenes NOT DETECTED NOT DETECTED Final   A.calcoaceticus-baumannii NOT DETECTED NOT DETECTED Final   Bacteroides fragilis NOT DETECTED NOT DETECTED Final   Enterobacterales NOT DETECTED NOT DETECTED Final   Enterobacter cloacae complex NOT DETECTED NOT DETECTED Final   Escherichia coli NOT DETECTED NOT DETECTED Final   Klebsiella aerogenes NOT DETECTED NOT DETECTED Final   Klebsiella oxytoca NOT DETECTED NOT DETECTED Final   Klebsiella pneumoniae NOT DETECTED NOT DETECTED Final   Proteus  species NOT DETECTED NOT DETECTED Final   Salmonella species NOT DETECTED NOT DETECTED Final   Serratia marcescens NOT DETECTED NOT DETECTED Final   Haemophilus influenzae NOT DETECTED NOT DETECTED Final   Neisseria meningitidis NOT DETECTED NOT DETECTED Final   Pseudomonas aeruginosa NOT DETECTED NOT DETECTED Final   Stenotrophomonas maltophilia NOT DETECTED NOT DETECTED Final   Candida albicans NOT DETECTED NOT DETECTED Final   Candida auris NOT DETECTED NOT DETECTED Final   Candida glabrata NOT DETECTED NOT DETECTED Final   Candida krusei NOT DETECTED NOT DETECTED Final   Candida parapsilosis NOT DETECTED NOT DETECTED Final   Candida tropicalis NOT DETECTED NOT DETECTED Final   Cryptococcus neoformans/gattii NOT DETECTED NOT DETECTED Final   Methicillin resistance mecA/C NOT DETECTED NOT DETECTED Final    Comment: Performed at [facility], [address] [place], [place] [address]  CULTURE, BLOOD (ROUTINE X 2) w Reflex to ID Panel     Status: None (Preliminary result)   Collection Time: 09/30/20  4:13 PM   Specimen: BLOOD  Result Value Ref Range Status   Specimen Description BLOOD LEFT ANTECUBITAL  Final   Special Requests   Final    BOTTLES DRAWN AEROBIC AND ANAEROBIC Blood Culture adequate volume   Culture   Final    NO GROWTH 4 DAYS Performed at [facility], [address] [place], [place] [address]    Report Status PENDING  Incomplete  CULTURE, BLOOD (ROUTINE X 2) w Reflex to ID Panel     Status: None (Preliminary result)   Collection Time: 09/30/20  4:13 PM   Specimen: BLOOD  Result Value Ref Range Status   Specimen Description BLOOD BLOOD LEFT HAND  Final   Special Requests   Final    BOTTLES DRAWN AEROBIC AND ANAEROBIC Blood Culture  adequate volume   Culture   Final    NO GROWTH 4 DAYS Performed at [facility], [address], [place], [place] [address]    Report Status PENDING  Incomplete         Radiology Studies: No  results found.      Scheduled Meds: . apixaban  2.5 mg Oral BID  . donepezil  10 mg Oral Daily  . feeding supplement  237 mL Oral BID BM  . metoprolol tartrate  50 mg Oral BID  . midodrine  5 mg Oral TID WC  . multivitamin-lutein  1 capsule Oral Daily   Continuous Infusions: . sodium chloride Stopped (10/03/20 2155)  . cefTRIAXone (ROCEPHIN)  IV Stopped (10/03/20 2055)    Assessment & Plan:   Principal Problem:   Severe sepsis (HCC) Active Problems:   Benign essential hypertension   Chronic anemia   Mixed Alzheimer's and vascular dementia (HCC)   AKI (acute kidney injury) (HCC)   Sacral decubitus ulcer   Cellulitis   AF (paroxysmal atrial fibrillation) (HCC)   Chronic anticoagulation   Functional quadriplegia (HCC)   History of lacunar cerebrovascular accident   UTI (urinary tract infection)   80 year old female with history of paroxysmal A. fib on Eliquis, Alzheimer's disease and total care dependent, hypertension, hx of lacunar infarct and osteoarthritis sent from PCP with concern for sacral skin breakdown associated with decreased oral intake and generalized weakness.      Severe sepsis (HCC)   Cellulitis/Blisters/ infected decubitus left buttock   UTI  - Patient  meeting sepsis criteria including leukocytosis, hypotension, tachycardia, AKI and tachypnea - Sepsis source, suspect cellulitis/infected decubitus ulcer as well as UTI 1/21-afebrile, leukocytosis improved.  Ucx + ,for >100 lactobacillus, more than 10,000 E. Coli 1/2 bcx staph epi-and staph hominis likely contaminent-discussed with ID per Dr. Algis Liming 1/22-lactic acid improved with ivf for hydration 1/23-discontinue IV fluids 1/24 hemodynamics more stable bcx tdn Can dc ceftriaxone See wound care nsg note         AKI (acute kidney injury) (HCC) - Creatinine 2.15, up from 1.0 on 1/10 at doctor's office -  Likely related to combination of sepsis and decreased p.o. intake  1/24-improved with  hydration Avoid nephrotoxic meds Encourage hydration and po intake          AF (paroxysmal atrial fibrillation) with RVR   Chronic anticoagulation - Ventricular response increase  likely secondary to sepsis/dehydration -1/23-had a run of SVT 1/24-HR variable but better controlled Cards ok pt for dc Continue metoprolol 50mg  bid Continue eliquis       Benign essential hypertension Continue on midodrine Continue on beta blk        Chronic anemia - Hemoglobin 9.1 which is baseline Stool occult negative H&H stable continue to monitor periodically     Mixed Alzheimer's and vascular dementia (HCC)   Functional quadriplegia (HCC) - According to daughter, patient is total care dependent and needs assistance in all ADLs - Dysphagia 2 diet and nutritionist eval - TOC consulted. Daughter would be interested in skilled rehab but states her father might be less interested - Fall and aspiration precautions and increase nursing assistance SPL saw patient please see note    History of lacunar cerebrovascular accident -  On statin      DVT prophylaxis: Eliquis Code Status: DNR Family Communication:spoke to daughter  Status is: Inpatient  Remains inpatient appropriate because:Unsafe discharge  Dispo: The patient is from: Home  Anticipated d/c is to: SNF              Anticipated d/c date is:TBD              Patient currently is medically stable. Pt snf bed pending as unable to accept her today.        LOS: 6 days   Time spent: 35 minutes with more than 50% on COC    Lynn ItoSahar Geovonni Meyerhoff, MD [facility] Pager [phone]  If 7PM-7AM, please contact night-coverage 10/04/2020, 8:42 PM

## 2020-10-04 NOTE — Care Management Important Message (Signed)
Important Message  Patient Details  Name: Laurie Jackson MRN: [id number] Date of Birth: 11/25/1940   Medicare Important Message Given:  Yes     [name] 10/04/2020, 12:23 PM

## 2020-10-04 NOTE — Progress Notes (Signed)
Occupational Therapy Treatment Patient Details Name: Laurie Jackson MRN: [id number] DOB: 1941-05-12 Today's Date: 10/04/2020    History of present illness 80 y.o. female with medical history significant for Paroxysmal A. fib on Eliquis, Alzheimer's disease and total care dependent, hypertension, hx of lacunar infarct and osteoarthritis who presented to the emergency room on the advice of her PCP after she developed an area of skin breakdown on her buttock a week ago that has been rapidly progressing. Per daughter in the last few months pt has had even more signficicant decline with mobilit, using Hoyer lift instead of w/c with increasing difficulty managing her daily needs.   OT comments  Ms. Vanderhoff continues to present with generalized weakness and BUE tremors that impacts her safety and independence in self care tasks.  Pt presents as lethargic this date, but was pleasant and agreeable to today's session focused on ADLs.  Pt endorsed desire to wash face, but then requested total assist from OTR when prompted to do so, stating fatigue.  Pt endorsed desire to eat her lunch, of which OTR provided moderate-maximum assist for self-feeding at bedlevel with HOB elevated.  Pt with BUE weakness as well as intention tremors that impacts ability to self-feed.  OTR provided hand over hand assist for stabilization.  OTR educated pt on additional strategies to improve self-feeding, including stabilizing elbow on tray and using weighted fork.  OTR observed improvement in tremors and ability to self-feed with use of weighted fork, but pt reports it is too heavy to continue use.  OTR provided mod-max assist for pt to drink Ensure and tea to optimize nutrition needs.  Ms. Seaborn declined further ADL practice stating she felt fatigued.  Pt declined pain, but did have shallow breathing and appeared anxious with all movement.  She will continue to benefit from skilled OT services in acute setting to address functional  strengthening and safety and independence in ADLs.  SNF remains most appropriate discharge recommendation.   Follow Up Recommendations  SNF    Equipment Recommendations  Other (comment) (TBD)    Recommendations for Other Services      Precautions / Restrictions Precautions Precautions: Fall Restrictions Weight Bearing Restrictions: No       Mobility Bed Mobility Overal bed mobility: Needs Assistance             General bed mobility comments: Pt declined this date  Transfers Overall transfer level: Needs assistance                    Balance Overall balance assessment: Needs assistance                                         ADL either performed or assessed with clinical judgement   ADL Overall ADL's : Needs assistance/impaired                                       General ADL Comments: OTR provided mod-max assist for self feeding at bedlevel 2/2 UE tremor and weakness.     Vision Patient Visual Report: No change from baseline     Perception     Praxis      Cognition Arousal/Alertness: Awake/alert Behavior During Therapy: Anxious;WFL for tasks assessed/performed Overall Cognitive Status: History of cognitive impairments - at baseline  General Comments: appears anxious with all movement, but was pleasant and agreeable to therapy.  Decreased orientation to situation/date        Exercises Other Exercises Other Exercises: provided mod-max assist for self-feeding, total assist to wash face, education and practice using weighted fork to improve self-feeding with UE tremors   Shoulder Instructions       General Comments      Pertinent Vitals/ Pain       Pain Assessment: No/denies pain (declines pain, but appears anxious/shallow breathing with all movement)  Home Living                                          Prior Functioning/Environment               Frequency  Min 1X/week        Progress Toward Goals  OT Goals(current goals can now be found in the care plan section)  Progress towards OT goals: Progressing toward goals  Acute Rehab OT Goals Patient Stated Goal: daughter hopes to be able to transition to rehab OT Goal Formulation: With patient/family Time For Goal Achievement: 10/17/20 Potential to Achieve Goals: Good  Plan Discharge plan remains appropriate;Frequency remains appropriate    Co-evaluation                 AM-PAC OT "6 Clicks" Daily Activity     Outcome Measure   Help from another person eating meals?: A Lot Help from another person taking care of personal grooming?: A Little Help from another person toileting, which includes using toliet, bedpan, or urinal?: A Lot Help from another person bathing (including washing, rinsing, drying)?: A Lot Help from another person to put on and taking off regular upper body clothing?: A Lot Help from another person to put on and taking off regular lower body clothing?: A Lot 6 Click Score: 13    End of Session    OT Visit Diagnosis: Other abnormalities of gait and mobility (R26.89);Muscle weakness (generalized) (M62.81)   Activity Tolerance Patient limited by fatigue   Patient Left in bed;with call bell/phone within reach;with bed alarm set   Nurse Communication          Time: 4010-2725 OT Time Calculation (min): 15 min  Charges: OT General Charges $OT Visit: 1 Visit OT Treatments $Self Care/Home Management : 8-22 mins  Kathyrn Drown Murice Barbar, OTR/L 10/04/20, 4:06 PM

## 2020-10-04 NOTE — Progress Notes (Signed)
[facility] Cardiology [facility] Encounter Note  Patient: Laurie Jackson / Admit Date: 09/28/2020 / Date of Encounter: 10/04/2020, 12:56 PM   Subjective: 1/24.  Overall condition of the patient has not significantly changed from yesterday.  Patient not conversing well at this time due to dementia and stroke.  Patient overall heart rate control is reasonable at this time with some higher heart rates at times.  As long as average heart rate to remains around 100 bpm we will continue current medical regimen to reduce the possibility of hypotension and other side effects of the medications  Review of Systems: Cannot assess  Objective: Telemetry: Atrial fibrillation with variable heart rate Physical Exam: Blood pressure (!) 116/92, pulse 81, temperature 97.6 F (36.4 C), temperature source Oral, resp. rate 18, height 5\' 7"  (1.702 m), weight 55.4 kg, SpO2 100 %. Body mass index is 19.12 kg/m. General: Well developed, well nourished, in no acute distress. Head: Normocephalic, atraumatic, sclera non-icteric, no xanthomas, nares are without discharge. Neck: No apparent masses Lungs: Normal respirations with few wheezes, some rhonchi, no rales , no crackles   Heart: Here regular rate and rhythm, normal S1 S2, no murmur, no rub, no gallop, PMI is normal size and placement, carotid upstroke normal without bruit, jugular venous pressure normal Abdomen: Soft, non-tender, non-distended with normoactive bowel sounds. No hepatosplenomegaly. Abdominal aorta is normal size without bruit Extremities: Trace edema, no clubbing, no cyanosis, no ulcers,  Peripheral: 2+ radial, 2+ femoral, 2+ dorsal pedal pulses Neuro: Not alert and oriented. Moves all extremities spontaneously. Psych: Does not responds to questions appropriately with a normal affect.   Intake/Output Summary (Last 24 hours) at 10/04/2020 1256 Last data filed at 10/04/2020 1130 Gross per 24 hour  Intake 467 ml  Output 0 ml  Net 467 ml     Inpatient Medications:  . apixaban  2.5 mg Oral BID  . donepezil  10 mg Oral Daily  . feeding supplement  237 mL Oral BID BM  . metoprolol tartrate  50 mg Oral BID  . midodrine  5 mg Oral TID WC  . multivitamin-lutein  1 capsule Oral Daily   Infusions:  . sodium chloride Stopped (10/03/20 2155)  . cefTRIAXone (ROCEPHIN)  IV Stopped (10/03/20 2055)    Labs: Recent Labs    10/02/20 0926  NA 142  K 3.5  CL 112*  CO2 20*  GLUCOSE 95  BUN 16  CREATININE 0.77  CALCIUM 8.4*   No results for input(s): AST, ALT, ALKPHOS, BILITOT, PROT, ALBUMIN in the last 72 hours. Recent Labs    10/04/20 0526  WBC 7.3  HGB 8.2*  HCT 24.6*  MCV 84.8  PLT 416*   No results for input(s): CKTOTAL, CKMB, TROPONINI in the last 72 hours. Invalid input(s): POCBNP No results for input(s): HGBA1C in the last 72 hours.   Weights: Filed Weights   10/02/20 0359 10/03/20 0417 10/04/20 0344  Weight: 54.5 kg 55.6 kg 55.4 kg     Radiology/Studies:  DG Chest Port 1 View  Result Date: 09/28/2020 CLINICAL DATA:  Pressure wounds EXAM: PORTABLE CHEST 1 VIEW COMPARISON:  07/23/2014 FINDINGS: The heart size and mediastinal contours are within normal limits. Both lungs are clear. The visualized skeletal structures are unremarkable. IMPRESSION: No active disease. Electronically Signed   By: 13/08/2014 M.D.   On: 09/28/2020 19:22     Assessment and Recommendation  80 y.o. female with known dementia with previous stroke hypertension hyperlipidemia anemia having acute decubitus ulcer with infection and  paroxysmal nonvalvular atrial fibrillation with rapid ventricular rate without evidence of congestive heart failure or myocardial infarction 1.  Continuation of current dose of 50 mg of metoprolol twice per day for heart rate control with average heart rate around 100 bpm 2.  Anticoagulation for further risk reduction of stroke with atrial fibrillation without change 3.  Continue supportive care of  ulcer 4.  No further cardiac diagnostics necessary at this time 5.  Okay for discharged home from cardiac standpoint with above treatment, and follow-up later for adjustments of medication  Signed, Arnoldo Hooker M.D. FACC

## 2020-10-04 NOTE — Progress Notes (Signed)
Physical Therapy Treatment Patient Details Name: Laurie Jackson MRN: [id number] DOB: 10-18-1940 Today's Date: 10/04/2020    History of Present Illness 80 y.o. female with medical history significant for Paroxysmal A. fib on Eliquis, Alzheimer's disease and total care dependent, hypertension, hx of lacunar infarct and osteoarthritis who presented to the emergency room on the advice of her PCP after she developed an area of skin breakdown on her buttock a week ago that has been rapidly progressing. Per daughter in the last few months pt has had even more signficicant decline with mobilit, using Hoyer lift instead of w/c with increasing difficulty managing her daily needs.    PT Comments    Pt received upright in bed upon arrival.  Pt remains confused but pleasant.  Difficult to understand due to cognitive ability and vocal output.  Pt was agreeable to therapy and participated in bed-level exercises.  Pt was able to assist therapist in performing exercises but required modA from therapist for completing full ROM with exercises.  Pt required rest breaks in between exercises and pt was educated on importance for remaining active in between bouts of therapy.  Pt stated that she understood.  Pt declined to perform bed mobility stating that she was too fatigued from yesterday.  Therapist will attempt this afternoon to see pt to see if bed mobility can be performed.  Current discharge plans to SNF remain appropriate at this time.  Pt will continue to benefit from skilled therapy in order to address deficits listed below.    Follow Up Recommendations  SNF;Supervision/Assistance - 24 hour     Equipment Recommendations   (TBD)    Recommendations for Other Services       Precautions / Restrictions Precautions Precautions: Fall Restrictions Weight Bearing Restrictions: No    Mobility  Bed Mobility               General bed mobility comments: Pt refused to perform bed mobility this morning.   Pt states she performed this morning and is too tired from yesterday.  Transfers                    Ambulation/Gait                 Stairs             Wheelchair Mobility    Modified Rankin (Stroke Patients Only)       Balance                                            Cognition Arousal/Alertness: Awake/alert Behavior During Therapy: Restless Overall Cognitive Status: History of cognitive impairments - at baseline                                        Exercises Total Joint Exercises Ankle Circles/Pumps: AAROM;Strengthening;Both;10 reps;Supine Quad Sets: AAROM;Strengthening;Both;10 reps;Supine Gluteal Sets: AROM;Strengthening;Both;10 reps;Supine Hip ABduction/ADduction: AAROM;Strengthening;Both;10 reps;Supine Straight Leg Raises: AAROM;Strengthening;Both;10 reps;Supine Knee Flexion: AAROM;Strengthening;Both;10 reps;Supine    General Comments        Pertinent Vitals/Pain Pain Assessment:  (unable to rate, indicates general arthritic pain with most LE movement)    Home Living Family/patient expects to be discharged to:: Skilled nursing facility  Prior Function Level of Independence: Needs assistance  Gait / Transfers Assistance Needed: SPT bed<>BSC<>chair - family assists ADL's / Homemaking Assistance Needed: SETUP feeding, assist bathing/dressing     PT Goals (current goals can now be found in the care plan section) Acute Rehab PT Goals Patient Stated Goal: daughter hopes to be able to transition to rehab PT Goal Formulation: With patient Time For Goal Achievement: 10/13/20 Potential to Achieve Goals: Fair Progress towards PT goals: Progressing toward goals    Frequency    Min 2X/week      PT Plan      Co-evaluation              AM-PAC PT "6 Clicks" Mobility   Outcome Measure  Help needed turning from your back to your side while in a flat bed without using  bedrails?: A Lot Help needed moving from lying on your back to sitting on the side of a flat bed without using bedrails?: A Lot Help needed moving to and from a bed to a chair (including a wheelchair)?: Total Help needed standing up from a chair using your arms (e.g., wheelchair or bedside chair)?: A Lot Help needed to walk in hospital room?: Total Help needed climbing 3-5 steps with a railing? : Total 6 Click Score: 9    End of Session   Activity Tolerance: Patient limited by fatigue (mental status limits ability to fully participate)     PT Visit Diagnosis: Muscle weakness (generalized) (M62.81);Unsteadiness on feet (R26.81)     Time: 2831-5176 PT Time Calculation (min) (ACUTE ONLY): 26 min  Charges:  $Therapeutic Exercise: 23-37 mins                     Nolon Bussing, PT, DPT 10/04/20, 10:17 AM

## 2020-10-05 DIAGNOSIS — G003 Staphylococcal meningitis: Secondary | ICD-10-CM | POA: Diagnosis not present

## 2020-10-05 DIAGNOSIS — N179 Acute kidney failure, unspecified: Secondary | ICD-10-CM

## 2020-10-05 DIAGNOSIS — I480 Paroxysmal atrial fibrillation: Secondary | ICD-10-CM

## 2020-10-05 DIAGNOSIS — L89152 Pressure ulcer of sacral region, stage 2: Secondary | ICD-10-CM | POA: Diagnosis not present

## 2020-10-05 LAB — CULTURE, BLOOD (ROUTINE X 2)
Culture: NO GROWTH
Culture: NO GROWTH
Special Requests: ADEQUATE
Special Requests: ADEQUATE

## 2020-10-05 LAB — CORTISOL-AM, BLOOD: Cortisol - AM: 3.1 ug/dL — ABNORMAL LOW (ref 6.7–22.6)

## 2020-10-05 MED ORDER — CEFAZOLIN SODIUM-DEXTROSE 2-4 GM/100ML-% IV SOLN
2.0000 g | Freq: Three times a day (TID) | INTRAVENOUS | Status: DC
  Filled 2020-10-05 (×2): qty 100

## 2020-10-05 MED ORDER — METOPROLOL TARTRATE 50 MG PO TABS
50.0000 mg | ORAL_TABLET | Freq: Three times a day (TID) | ORAL | Status: DC
  Administered 2020-10-05 (×3): 50 mg via ORAL
  Filled 2020-10-05 (×3): qty 1

## 2020-10-05 NOTE — Progress Notes (Signed)
[facility] Cardiology [facility] Encounter Note  Patient: Laurie Jackson / Admit Date: 09/28/2020 / Date of Encounter: 10/05/2020, 1:13 PM   Subjective: 1/24.  Overall condition of the patient has not significantly changed from yesterday.  Patient not conversing well at this time due to dementia and stroke.  Patient overall heart rate control is reasonable at this time with some higher heart rates at times.  As long as average heart rate to remains around 100 bpm we will continue current medical regimen to reduce the possibility of hypotension and other side effects of the medications  1/25.  Patient not conversant this a.m.  Overall heart rate control still reasonable at 103 bpm with patient being nonambulatory.  Some periods suggest possible normal rhythm and well evaluate by EKG Review of Systems: Cannot assess  Objective: Telemetry: Atrial fibrillation with variable heart rate Physical Exam: Blood pressure 118/77, pulse 74, temperature 98.4 F (36.9 C), temperature source Oral, resp. rate 15, height 5\' 7"  (1.702 m), weight 59.4 kg, SpO2 100 %. Body mass index is 20.52 kg/m. General: Well developed, well nourished, in no acute distress. Head: Normocephalic, atraumatic, sclera non-icteric, no xanthomas, nares are without discharge. Neck: No apparent masses Lungs: Normal respirations with few wheezes, some rhonchi, no rales , no crackles   Heart: Here regular rate and rhythm, normal S1 S2, no murmur, no rub, no gallop, PMI is normal size and placement, carotid upstroke normal without bruit, jugular venous pressure normal Abdomen: Soft, non-tender, non-distended with normoactive bowel sounds. No hepatosplenomegaly. Abdominal aorta is normal size without bruit Extremities: Trace edema, no clubbing, no cyanosis, no ulcers,  Peripheral: 2+ radial, 2+ femoral, 2+ dorsal pedal pulses Neuro: Not alert and oriented. Moves all extremities spontaneously. Psych: Does not responds to questions  appropriately with a normal affect.   Intake/Output Summary (Last 24 hours) at 10/05/2020 1313 Last data filed at 10/04/2020 1900 Gross per 24 hour  Intake -  Output 400 ml  Net -400 ml    Inpatient Medications:  . apixaban  2.5 mg Oral BID  . donepezil  10 mg Oral Daily  . feeding supplement  237 mL Oral BID BM  . metoprolol tartrate  50 mg Oral TID  . midodrine  5 mg Oral TID WC  . multivitamin-lutein  1 capsule Oral Daily   Infusions:  . sodium chloride 250 mL (10/04/20 2108)  . [START ON 10/06/2020]  ceFAZolin (ANCEF) IV      Labs: No results for input(s): NA, K, CL, CO2, GLUCOSE, BUN, CREATININE, CALCIUM, MG, PHOS in the last 72 hours. No results for input(s): AST, ALT, ALKPHOS, BILITOT, PROT, ALBUMIN in the last 72 hours. Recent Labs    10/04/20 0526  WBC 7.3  HGB 8.2*  HCT 24.6*  MCV 84.8  PLT 416*   No results for input(s): CKTOTAL, CKMB, TROPONINI in the last 72 hours. Invalid input(s): POCBNP No results for input(s): HGBA1C in the last 72 hours.   Weights: Filed Weights   10/03/20 0417 10/04/20 0344 10/05/20 0100  Weight: 55.6 kg 55.4 kg 59.4 kg     Radiology/Studies:  DG Chest Port 1 View  Result Date: 09/28/2020 CLINICAL DATA:  Pressure wounds EXAM: PORTABLE CHEST 1 VIEW COMPARISON:  07/23/2014 FINDINGS: The heart size and mediastinal contours are within normal limits. Both lungs are clear. The visualized skeletal structures are unremarkable. IMPRESSION: No active disease. Electronically Signed   By: 13/08/2014 M.D.   On: 09/28/2020 19:22     Assessment and Recommendation  80 y.o. female with known dementia with previous stroke hypertension hyperlipidemia anemia having acute decubitus ulcer with infection and paroxysmal nonvalvular atrial fibrillation with rapid ventricular rate without evidence of congestive heart failure or myocardial infarction 1.  Continuation of current dose of 50 mg of metoprolol twice per day for heart rate control with  average heart rate around 100 bpm 2.  Anticoagulation for further risk reduction of stroke with atrial fibrillation without change 3.  Continue supportive care of ulcer 4.  No further cardiac diagnostics necessary at this time 5.  Okay for discharged home from cardiac standpoint with above treatment, and follow-up later for adjustments of medication as necessary  Signed, Arnoldo Hooker M.D. FACC

## 2020-10-05 NOTE — Progress Notes (Addendum)
PROGRESS NOTE    [name]  WUX:[id number] DOB: December 27, 1940 DOA: 09/28/2020 PCP: Marguarite Arbour, MD    Brief Narrative:  Laurie Jackson is a 80 y.o. female with medical history significant for Paroxysmal A. fib on Eliquis, Alzheimer's disease and total care dependent, hypertension, hx of lacunar infarct and osteoarthritis who presented to the emergency room on the advice of her PCP after she developed an area of skin breakdown on her buttock a week ago that has been rapidly progressing.  Pt has been rapidly declining over the past 2 months and currently she needs assistance for all activities of daily living including feeding, grooming and transfers.  Daughter has moved to live with her parents to assist her father and providing full-time care to her mother .  She states that over the past couple weeks her mother has  been very weak and has been spending most of the time in the bed.   Over the past week they noticed the area of skin blistering and was thus advised by the PCP to present to the emergency room.  Found with A. fib RVR here, cardiology was consulted. Medications adjusted. HR variable but improving.  On admission patient was found with severe sepsis thought it was due to cellulitis infected decubitus of the left buttocks and UTI.  She completed antibiotics for UTI.  She also has a blood culture staph epi and staph hominis likely contaminated.  ID has been consulted they do not feel at this time the decubitus sacral area is overtly infected.  Also her cortisol is low. Obtaining am ACTH.   1/19- afib rvr today  1/20-ms better today.  Hr improving  1/21-spoke to daughter, states moms MS much improved 85%.  HR better controlled.  1/22-no overnight issues. Elevated lactic acid improved with ivf.  1/23-no overnight issues. Daughter at bedside and reports good po intake but has to remind her to eat.had a run of svt, hr still elevated 1/24-HR low teens on tele 1/25-HR 105-129. Periods  of SR. Asx.   Consultants:   Cardiology  Procedures:   Antimicrobials:    ceftriaxone  Subjective: No sob, cp, or dizziness   Objective: Vitals:   10/04/20 1817 10/04/20 2044 10/05/20 0100 10/05/20 0352  BP: (!) 157/103 113/89  123/83  Pulse: 99 (!) 102  91  Resp:  17  20  Temp:  99.1 F (37.3 C)  99.2 F (37.3 C)  TempSrc:  Oral    SpO2:  100%  95%  Weight:   59.4 kg   Height:        Intake/Output Summary (Last 24 hours) at 10/05/2020 0831 Last data filed at 10/04/2020 1900 Gross per 24 hour  Intake 237 ml  Output 400 ml  Net -163 ml   Filed Weights   10/03/20 0417 10/04/20 0344 10/05/20 0100  Weight: 55.6 kg 55.4 kg 59.4 kg    Examination: Pleasant, communicative CTA, no wheeze rales rhonchi Regular/irregular S1-S2 no gallops Soft nontender positive bowel sounds No edema, improved Awake and alert, grossly intact responding appropriately Mood and affect appropriate in current setting and appears to be close to baseline     Data Reviewed: I have personally reviewed following labs and imaging studies  CBC: Recent Labs  Lab 09/28/20 1811 09/29/20 0652 10/01/20 0409 10/04/20 0526  WBC 14.2* 9.9 8.7 7.3  NEUTROABS 9.1*  --   --   --   HGB 10.6* 9.1* 8.7* 8.2*  HCT 32.4* 27.4* 26.7* 24.6*  MCV 86.4  85.6 85.9 84.8  PLT 629* 524* 518* 416*   Basic Metabolic Panel: Recent Labs  Lab 09/28/20 1811 09/29/20 0652 09/30/20 0607 10/01/20 0409 10/02/20 0926  NA 143 139 140 139 142  K 4.0 3.6 3.5 3.3* 3.5  CL 105 107 107 107 112*  CO2 23 21* 22 19* 20*  GLUCOSE 115* 99 93 93 95  BUN 48* 33* 25* 23 16  CREATININE 2.15* 1.17* 0.88 0.87 0.77  CALCIUM 9.7 8.8* 8.8* 8.8* 8.4*  MG  --   --   --  1.6*  --    GFR: Estimated Creatinine Clearance: 55.2 mL/min (by C-G formula based on SCr of 0.77 mg/dL). Liver Function Tests: Recent Labs  Lab 09/28/20 1811  AST 17  ALT 17  ALKPHOS 43  BILITOT 0.9  PROT 7.2  ALBUMIN 3.0*   No results for  input(s): LIPASE, AMYLASE in the last 168 hours. No results for input(s): AMMONIA in the last 168 hours. Coagulation Profile: Recent Labs  Lab 09/28/20 1811 09/29/20 0652  INR 1.9* 1.8*   Cardiac Enzymes: No results for input(s): CKTOTAL, CKMB, CKMBINDEX, TROPONINI in the last 168 hours. BNP (last 3 results) No results for input(s): PROBNP in the last 8760 hours. HbA1C: No results for input(s): HGBA1C in the last 72 hours. CBG: Recent Labs  Lab 10/02/20 0811 10/02/20 1312 10/02/20 1806  GLUCAP 87 114* 89   Lipid Profile: No results for input(s): CHOL, HDL, LDLCALC, TRIG, CHOLHDL, LDLDIRECT in the last 72 hours. Thyroid Function Tests: No results for input(s): TSH, T4TOTAL, FREET4, T3FREE, THYROIDAB in the last 72 hours. Anemia Panel: No results for input(s): VITAMINB12, FOLATE, FERRITIN, TIBC, IRON, RETICCTPCT in the last 72 hours. Sepsis Labs: Recent Labs  Lab 09/29/20 0652 09/30/20 0923 10/01/20 0917 10/01/20 1150 10/02/20 0926 10/02/20 1133  PROCALCITON 0.14  --   --   --   --   --   LATICACIDVEN  --    < > 1.3 3.6* 1.2 1.6   < > = values in this interval not displayed.    Recent Results (from the past 240 hour(s))  Resp Panel by RT-PCR (Flu A&B, Covid) Nasopharyngeal Swab     Status: None   Collection Time: 09/28/20  6:12 PM   Specimen: Nasopharyngeal Swab; Nasopharyngeal(NP) swabs in vial transport medium  Result Value Ref Range Status   SARS Coronavirus 2 by RT PCR NEGATIVE NEGATIVE Final    Comment: (NOTE) SARS-CoV-2 target nucleic acids are NOT DETECTED.  The SARS-CoV-2 RNA is generally detectable in upper respiratory specimens during the acute phase of infection. The lowest concentration of SARS-CoV-2 viral copies this assay can detect is 138 copies/mL. A negative result does not preclude SARS-Cov-2 infection and should not be used as the sole basis for treatment or other patient management decisions. A negative result may occur with  improper  specimen collection/handling, submission of specimen other than nasopharyngeal swab, presence of viral mutation(s) within the areas targeted by this assay, and inadequate number of viral copies(<138 copies/mL). A negative result must be combined with clinical observations, patient history, and epidemiological information. The expected result is Negative.  Fact Sheet for Patients:  BloggerCourse.comhttps://www.fda.gov/media/152166/download  Fact Sheet for Healthcare Providers:  SeriousBroker.ithttps://www.fda.gov/media/152162/download  This test is no t yet approved or cleared by the [place] FDA and  has been authorized for detection and/or diagnosis of SARS-CoV-2 by FDA under an Emergency Use Authorization (EUA). This EUA will remain  in effect (meaning this test can be used) for the  duration of the COVID-19 declaration under Section 564(b)(1) of the Act, 21 U.S.C.section 360bbb-3(b)(1), unless the authorization is terminated  or revoked sooner.       Influenza A by PCR NEGATIVE NEGATIVE Final   Influenza B by PCR NEGATIVE NEGATIVE Final    Comment: (NOTE) The Xpert Xpress SARS-CoV-2/FLU/RSV plus assay is intended as an aid in the diagnosis of influenza from Nasopharyngeal swab specimens and should not be used as a sole basis for treatment. Nasal washings and aspirates are unacceptable for Xpert Xpress SARS-CoV-2/FLU/RSV testing.  Fact Sheet for Patients: BloggerCourse.com  Fact Sheet for Healthcare Providers: SeriousBroker.it  This test is not yet approved or cleared by the [place] FDA and has been authorized for detection and/or diagnosis of SARS-CoV-2 by FDA under an Emergency Use Authorization (EUA). This EUA will remain in effect (meaning this test can be used) for the duration of the COVID-19 declaration under Section 564(b)(1) of the Act, 21 U.S.C. section 360bbb-3(b)(1), unless the authorization is terminated or revoked.  Performed at  [facility], [address] [place], [place] [address]   Blood culture (single)     Status: None   Collection Time: 09/28/20  6:12 PM   Specimen: BLOOD  Result Value Ref Range Status   Specimen Description BLOOD BLOOD RIGHT FOREARM  Final   Special Requests   Final    BOTTLES DRAWN AEROBIC AND ANAEROBIC Blood Culture adequate volume   Culture   Final    NO GROWTH 5 DAYS Performed at [facility], [address], [place], [place] [address]    Report Status 10/03/2020 FINAL  Final  Blood culture (routine single)     Status: Abnormal   Collection Time: 09/28/20  6:16 PM   Specimen: BLOOD  Result Value Ref Range Status   Specimen Description   Final    BLOOD BLOOD LEFT HAND Performed at [facility], [address] [place], [place] [address]    Special Requests   Final    BOTTLES DRAWN AEROBIC AND ANAEROBIC Blood Culture results may not be optimal due to an inadequate volume of blood received in culture bottles Performed at [facility], [address] [place], [place] [address]    Culture  Setup Time   Final    GRAM POSITIVE COCCI IN BOTH AEROBIC AND ANAEROBIC BOTTLES CRITICAL RESULT CALLED TO, READ BACK BY AND VERIFIED WITH: KISHAN PATEL ON 09/29/20 AT 1317 QSD    Culture (A)  Final    STAPHYLOCOCCUS EPIDERMIDIS STAPHYLOCOCCUS HOMINIS THE SIGNIFICANCE OF ISOLATING THIS ORGANISM FROM A SINGLE SET OF BLOOD CULTURES WHEN MULTIPLE SETS ARE DRAWN IS UNCERTAIN. PLEASE NOTIFY THE MICROBIOLOGY DEPARTMENT WITHIN ONE WEEK IF SPECIATION AND SENSITIVITIES ARE REQUIRED. Performed at [facility], [address], [place], [place] [address]    Report Status 10/01/2020 FINAL  Final  Urine culture     Status: Abnormal   Collection Time: 09/28/20  6:16 PM   Specimen: Urine, Random  Result Value Ref Range Status   Specimen Description   Final    URINE, RANDOM Performed at [facility], [address], [place], [place] [address]     Special Requests   Final    NONE Performed at [facility], [address] [place], [place] [address]    Culture (A)  Final    >=100,000 COLONIES/mL LACTOBACILLUS SPECIES Standardized susceptibility testing for this organism is not available. 10,000 COLONIES/mL ESCHERICHIA COLI    Report Status 10/02/2020 FINAL  Final   Organism ID, Bacteria ESCHERICHIA COLI (A)  Final      Susceptibility   Escherichia coli - MIC*    AMPICILLIN <=2 SENSITIVE Sensitive     CEFAZOLIN <=4 SENSITIVE Sensitive     CEFEPIME <=0.12 SENSITIVE Sensitive     CEFTRIAXONE <=0.25 SENSITIVE Sensitive     CIPROFLOXACIN <=0.25 SENSITIVE Sensitive     GENTAMICIN <=1 SENSITIVE Sensitive     IMIPENEM <=0.25 SENSITIVE Sensitive     NITROFURANTOIN <=16 SENSITIVE Sensitive     TRIMETH/SULFA <=20 SENSITIVE Sensitive     AMPICILLIN/SULBACTAM <=2 SENSITIVE Sensitive     PIP/TAZO <=4 SENSITIVE Sensitive     * 10,000 COLONIES/mL ESCHERICHIA COLI  Blood Culture ID Panel (Reflexed)     Status: Abnormal   Collection Time: 09/28/20  6:16 PM  Result Value Ref Range Status   Enterococcus faecalis NOT DETECTED NOT DETECTED Final   Enterococcus Faecium NOT DETECTED NOT DETECTED Final   Listeria monocytogenes NOT DETECTED NOT DETECTED Final   Staphylococcus species DETECTED (A) NOT DETECTED Final    Comment: CRITICAL RESULT CALLED TO, READ BACK BY AND VERIFIED WITH: KISHAN PATEL ON 09/29/20 AT 1317 QSD    Staphylococcus aureus (BCID) NOT DETECTED NOT DETECTED Final   Staphylococcus epidermidis DETECTED (A) NOT DETECTED Final    Comment: CRITICAL RESULT CALLED TO, READ BACK BY AND VERIFIED WITH: KISHAN PATEL ON 09/29/20 AT 1317 QSD    Staphylococcus lugdunensis NOT DETECTED NOT DETECTED Final   Streptococcus species NOT DETECTED NOT DETECTED Final   Streptococcus agalactiae NOT DETECTED NOT DETECTED Final   Streptococcus pneumoniae NOT DETECTED NOT DETECTED Final   Streptococcus pyogenes NOT DETECTED NOT DETECTED  Final   A.calcoaceticus-baumannii NOT DETECTED NOT DETECTED Final   Bacteroides fragilis NOT DETECTED NOT DETECTED Final   Enterobacterales NOT DETECTED NOT DETECTED Final   Enterobacter cloacae complex NOT DETECTED NOT DETECTED Final   Escherichia coli NOT DETECTED NOT DETECTED Final   Klebsiella aerogenes NOT DETECTED NOT DETECTED Final   Klebsiella oxytoca NOT DETECTED NOT DETECTED Final   Klebsiella pneumoniae NOT DETECTED NOT DETECTED Final   Proteus species NOT DETECTED NOT DETECTED Final   Salmonella species NOT DETECTED NOT DETECTED Final   Serratia marcescens NOT DETECTED NOT DETECTED Final   Haemophilus influenzae NOT DETECTED NOT DETECTED Final   Neisseria meningitidis NOT DETECTED NOT DETECTED Final   Pseudomonas aeruginosa NOT DETECTED NOT DETECTED Final   Stenotrophomonas maltophilia NOT DETECTED NOT DETECTED Final   Candida albicans NOT DETECTED NOT DETECTED Final   Candida auris NOT DETECTED NOT DETECTED Final   Candida glabrata NOT DETECTED NOT DETECTED Final   Candida krusei NOT DETECTED NOT DETECTED Final   Candida parapsilosis NOT DETECTED NOT DETECTED Final   Candida tropicalis NOT DETECTED NOT DETECTED Final   Cryptococcus neoformans/gattii NOT DETECTED NOT DETECTED Final   Methicillin resistance mecA/C NOT DETECTED NOT DETECTED Final    Comment: Performed at [facility], [address] [place], [place] [address]  CULTURE, BLOOD (ROUTINE X 2) w Reflex to ID Panel     Status: None   Collection Time: 09/30/20  4:13 PM   Specimen: BLOOD  Result Value Ref Range Status   Specimen Description BLOOD LEFT ANTECUBITAL  Final   Special Requests   Final    BOTTLES DRAWN AEROBIC AND ANAEROBIC Blood Culture adequate volume   Culture   Final    NO GROWTH 5 DAYS Performed at [facility], [address] [place], [place]  [id number]    Report Status 10/05/2020 FINAL  Final  CULTURE, BLOOD (ROUTINE X 2) w Reflex to ID Panel     Status: None   Collection  Time: 09/30/20  4:13 PM   Specimen: BLOOD  Result Value Ref Range Status   Specimen Description BLOOD BLOOD LEFT HAND  Final   Special Requests   Final    BOTTLES DRAWN AEROBIC AND ANAEROBIC Blood Culture adequate volume   Culture   Final    NO GROWTH 5 DAYS Performed at [facility], [address] [place], [place] [address]    Report Status 10/05/2020 FINAL  Final         Radiology Studies: No results found.      Scheduled Meds: . apixaban  2.5 mg Oral BID  . donepezil  10 mg Oral Daily  . feeding supplement  237 mL Oral BID BM  . metoprolol tartrate  50 mg Oral TID  . midodrine  5 mg Oral TID WC  . multivitamin-lutein  1 capsule Oral Daily   Continuous Infusions: . sodium chloride 250 mL (10/04/20 2108)  . [START ON 10/06/2020]  ceFAZolin (ANCEF) IV      Assessment & Plan:   Principal Problem:   Severe sepsis (HCC) Active Problems:   Benign essential hypertension   Chronic anemia   Mixed Alzheimer's and vascular dementia (HCC)   AKI (acute kidney injury) (HCC)   Sacral decubitus ulcer   Cellulitis   AF (paroxysmal atrial fibrillation) (HCC)   Chronic anticoagulation   Functional quadriplegia (HCC)   History of lacunar cerebrovascular accident   UTI (urinary tract infection)   80 year old female with history of paroxysmal A. fib on Eliquis, Alzheimer's disease and total care dependent, hypertension, hx of lacunar infarct and osteoarthritis sent from PCP with concern for sacral skin breakdown associated with decreased oral intake and generalized weakness, found with sepsis..      Severe sepsis (HCC)   Cellulitis/Blisters/ infected decubitus left buttock   UTI  - Patient  meeting sepsis criteria including leukocytosis, hypotension, tachycardia, AKI and tachypnea - Sepsis source, suspect cellulitis/infected decubitus ulcer as well as UTI Ucx + ,for >100 lactobacillus, more than 10,000 E. Coli 1-bcx staph epi-and staph hominis likely  contaminent-discussed with ID per Dr. Algis LimingVandam 1/25-ID following- changed ceftriaxone to cefazolin for blood cx, which likely contaminant.  Lactic acid improved Afebrile and leukocytosis improved See wound care note -cortisol low level x2. Check ACTH with am labs.      AKI (acute kidney injury) (HCC) - Creatinine 2.15, up from 1.0 on 1/10 at doctor's office -  Likely related to combination of sepsis and decreased p.o. intake  1/25-improved with IV fluid and remained stable Encourage p.o. intake and hydration-discussed this with daughter  Avoid nephrotoxic meds Monitor periodically           AF (paroxysmal atrial fibrillation) with RVR   Chronic anticoagulation - Ventricular response increase  likely secondary to sepsis/dehydration -1/23-had a run of SVT 1/25-heart rate variable, today up to 129 bpm Increase metoprolol to 50 3 times daily with parameters Continue Eliquis  Cardiology okay for patient to be discharged  Follow-up with cardiology as outpatient       Benign essential hypertension BP was low was started on midodrine  Now BP more stable  Continue with beta-blockers but monitor closely as we have increase beta-blockers dosage as above         Chronic anemia - Hemoglobin 9.1 which is baseline Stool occult  negative H&H stable continue to monitor periodically     Mixed Alzheimer's and vascular dementia (HCC)   Functional quadriplegia (HCC) - According to daughter, patient is total care dependent and needs assistance in all ADLs - Dysphagia 2 diet and nutritionist eval - TOC consulted. Daughter would be interested in skilled rehab but states her father might be less interested - Fall and aspiration precautions and increase nursing assistance SPL saw patient please see note    History of lacunar cerebrovascular accident -  On statin      DVT prophylaxis: Eliquis Code Status: DNR Family Communication:spoke to daughter  Status is:  Inpatient  Remains inpatient appropriate because:Unsafe discharge  Dispo: The patient is from: Home              Anticipated d/c is to: SNF              Anticipated d/c date is:TBD              Patient currently is medically stable.  [facility] currently not accepting patients at this time due to staffing.  Case management is looking for other SNF bed placement        LOS: 7 days   Time spent: 35 minutes with more than 50% on COC    Lynn Ito, MD [facility] Pager [phone]  If 7PM-7AM, please contact night-coverage 10/05/2020, 8:31 AM

## 2020-10-05 NOTE — Progress Notes (Signed)
ID Pt awake Verbal but confused Answers simple questions BP 126/82 (BP Location: Left Arm)   Pulse 97   Temp 99.4 F (37.4 C) (Oral)   Resp 17   Ht 5\' 7"  (1.702 m)   Wt 59.4 kg   SpO2 100%   BMI 20.52 kg/m   Chest b/l air entry Hs irregular abd soft Cns- pt has tremulousness of the trunk and arms Sacrum     Labs CBC Latest Ref Rng & Units 10/04/2020 10/01/2020 09/29/2020  WBC 4.0 - 10.5 K/uL 7.3 8.7 9.9  Hemoglobin 12.0 - 15.0 g/dL 8.2(L) 8.7(L) 9.1(L)  Hematocrit 36.0 - 46.0 % 24.6(L) 26.7(L) 27.4(L)  Platelets 150 - 400 K/uL 416(H) 518(H) 524(H)   CMP Latest Ref Rng & Units 10/02/2020 10/01/2020 09/30/2020  Glucose 70 - 99 mg/dL 95 93 93  BUN 8 - 23 mg/dL 16 23 10/02/2020)  Creatinine 0.44 - 1.00 mg/dL 83(M 1.96 2.22  Sodium 135 - 145 mmol/L 142 139 140  Potassium 3.5 - 5.1 mmol/L 3.5 3.3(L) 3.5  Chloride 98 - 111 mmol/L 112(H) 107 107  CO2 22 - 32 mmol/L 20(L) 19(L) 22  Calcium 8.9 - 10.3 mg/dL 9.79) 8.9(Q) 1.1(H)  Total Protein 6.5 - 8.1 g/dL - - -  Total Bilirubin 0.3 - 1.2 mg/dL - - -  Alkaline Phos 38 - 126 U/L - - -  AST 15 - 41 U/L - - -  ALT 0 - 44 U/L - - -   Impression/recommendation 80 year old female with history of paroxysmal A. fib, dementia, hypertension was hospitalized on 09/28/2020 with skin breakdown.  Found to have borderline hypotension and low-grade fever  Stage II sacral decubitus.  Does not look infected.  Patient is being treated like sepsis with IV fluids.  On day 7 of antibiotic.  She does not need any further treatment. Low cortisol with level of 5.6 and repeat is 3.2.  This has to be replaced as this may cause hypotension as well as fever.  Staph epidermidis and staph hominis in 1 set of blood cultures likely skin contaminant.  Does not need to be treated.  Urine culture has lactobacillus and E. coli very likely contaminants.  Anyway she received ceftriaxone which would have treated the E. coli.  AKI.  Came in with a creatinine of 2.15 and it  has resolved completely with hydration.  A. fib on metoprolol and anticoagulation.  Seen by cardiology.  Dementia  Discussed the management with the care team.  ID will sign off.  Call if needed.

## 2020-10-05 NOTE — TOC Transition Note (Signed)
Transition of Care [facility]) - CM/SW Discharge Note   Patient Details  Name: Laurie Jackson MRN: [id number] Date of Birth: November 24, 1940  Transition of Care [facility]) CM/SW Contact:  Hetty Ely, RN Phone Number: 10/05/2020, 2:59 PM   Clinical Narrative:  Daughter requested SNF update, told her that the SNF's were not accepting patients at this time due to staffing. Also discussed with daughter that Mom was starting on new heart medication, therefore surveillance is needed, daughter voices understanding.           Patient Goals and CMS Choice        Discharge Placement                       Discharge Plan and Services                                     Social Determinants of Health (SDOH) Interventions     Readmission Risk Interventions No flowsheet data found.

## 2020-10-06 DIAGNOSIS — L03317 Cellulitis of buttock: Secondary | ICD-10-CM

## 2020-10-06 LAB — IRON AND TIBC
Iron: 44 ug/dL (ref 28–170)
Saturation Ratios: 23 % (ref 10.4–31.8)
TIBC: 189 ug/dL — ABNORMAL LOW (ref 250–450)
UIBC: 145 ug/dL

## 2020-10-06 LAB — VITAMIN B12: Vitamin B-12: 617 pg/mL (ref 180–914)

## 2020-10-06 LAB — ACTH: C206 ACTH: 21 pg/mL (ref 7.2–63.3)

## 2020-10-06 MED ORDER — METOPROLOL TARTRATE 50 MG PO TABS
100.0000 mg | ORAL_TABLET | Freq: Two times a day (BID) | ORAL | Status: DC
  Administered 2020-10-06 – 2020-10-08 (×5): 100 mg via ORAL
  Filled 2020-10-06 (×5): qty 2

## 2020-10-06 MED ORDER — PREDNISONE 2.5 MG PO TABS
2.5000 mg | ORAL_TABLET | Freq: Two times a day (BID) | ORAL | Status: DC
  Administered 2020-10-06 – 2020-10-08 (×4): 2.5 mg via ORAL
  Filled 2020-10-06 (×5): qty 1

## 2020-10-06 NOTE — Progress Notes (Signed)
Occupational Therapy Treatment Patient Details Name: Laurie Jackson MRN: [id number] DOB: Oct 24, 1940 Today's Date: 10/06/2020    History of present illness 80 y.o. female with medical history significant for Paroxysmal A. fib on Eliquis, Alzheimer's disease and total care dependent, hypertension, hx of lacunar infarct and osteoarthritis who presented to the emergency room on the advice of her PCP after she developed an area of skin breakdown on her buttock a week ago that has been rapidly progressing. Per daughter in the last few months pt has had even more signficicant decline with mobilit, using Hoyer lift instead of w/c with increasing difficulty managing her daily needs.   OT comments  Upon entering the room, pt supine in bed and sleeping soundly. OT able to alert pt for OT intervention. Pt is oriented to self only. She is pleasant and cooperative. Tremor noted in B UEs with all tasks. Pt needing hand over hand assistance to wash face and hands. Pt also needing hand over hand assistance to take sips from cup this session. Pt needing max multimodal cuing and increased time to process. AAROM exercises for wrist and elbow and PROM for digits and shoulder in all planes of movement x 10 reps each. Pt does grimace when opening hands but unable to verbalize. Pt remained in bed at end of session. Pt very limited this session and reports, " I just don't feel the best". Lab also arriving to take STAT blood draws. Bed alarm activated and all needs within reach.   Follow Up Recommendations  SNF    Equipment Recommendations  Other (comment) (defer to next venue of care)       Precautions / Restrictions Precautions Precautions: Fall              ADL either performed or assessed with clinical judgement   ADL Overall ADL's : Needs assistance/impaired Eating/Feeding: Total assistance Eating/Feeding Details (indicate cue type and reason): hand over hand Grooming: Wash/dry hands;Wash/dry face;Total  assistance Grooming Details (indicate cue type and reason): hand over hand                                     Vision Patient Visual Report: No change from baseline            Cognition Arousal/Alertness: Awake/alert Behavior During Therapy: WFL for tasks assessed/performed;Flat affect Overall Cognitive Status: History of cognitive impairments - at baseline                                 General Comments: Pt appears anxious and displays tremors of UEs with all movement. Pt is very pleasant and agreeable but oriented to self only.                   Pertinent Vitals/ Pain       Pain Assessment: No/denies pain         Frequency  Min 1X/week        Progress Toward Goals  OT Goals(current goals can now be found in the care plan section)  Progress towards OT goals: Not progressing toward goals - comment (total A hand over hand needed for all tasks)  Acute Rehab OT Goals Patient Stated Goal: none stated OT Goal Formulation: Patient unable to participate in goal setting Time For Goal Achievement: 10/17/20 Potential to Achieve Goals: Fair  Plan Discharge plan remains appropriate;Frequency  remains appropriate       AM-PAC OT "6 Clicks" Daily Activity     Outcome Measure   Help from another person eating meals?: Total Help from another person taking care of personal grooming?: Total Help from another person toileting, which includes using toliet, bedpan, or urinal?: Total Help from another person bathing (including washing, rinsing, drying)?: Total Help from another person to put on and taking off regular upper body clothing?: Total Help from another person to put on and taking off regular lower body clothing?: Total 6 Click Score: 6    End of Session    OT Visit Diagnosis: Other abnormalities of gait and mobility (R26.89);Muscle weakness (generalized) (M62.81)   Activity Tolerance Patient limited by fatigue   Patient Left in  bed;with call bell/phone within reach;with bed alarm set           Time: 1520-1545 OT Time Calculation (min): 25 min  Charges: OT General Charges $OT Visit: 1 Visit OT Treatments $Self Care/Home Management : 8-22 mins $Therapeutic Exercise: 8-22 mins  Jackquline Denmark, MS, OTR/L , CBIS ascom [phone]  10/06/20, 4:22 PM

## 2020-10-06 NOTE — Progress Notes (Signed)
Physical Therapy Treatment Patient Details Name: Laurie Jackson MRN: [id number] DOB: 12-01-1940 Today's Date: 10/06/2020    History of Present Illness 80 y.o. female with medical history significant for Paroxysmal A. fib on Eliquis, Alzheimer's disease and total care dependent, hypertension, hx of lacunar infarct and osteoarthritis who presented to the emergency room on the advice of her PCP after she developed an area of skin breakdown on her buttock a week ago that has been rapidly progressing. Per daughter in the last few months pt has had even more signficicant decline with mobilit, using Hoyer lift instead of w/c with increasing difficulty managing her daily needs.    PT Comments    Patient reclining in room with daughter at bedside feeding her upon arrival. Patient appeared pleasantly confused and stated she was done eating after a few more bites and appeared willing to participate in PT. After donning appropriate footwear and removing purwick in preparation for mobility, patient stated she did not want to attempt bed mobility or bed exercises. Daughter encouraged her to participate and suggested PT use a counting technique to help encourage patient to participate that they use at home. Patient seemed more willing to participate and allowed PT to assist her supine to sit mod A with counting but then became agitated, stating she wanted to return to supine and would not flex her hips or knees fully as appropriate. Assisted pt back to bed with support at the trunk and legs and repositioned her in the bed with the help of nursing and draw sheet. Patient continues to be limited in functional mobility and requires short term rehab upon discharge from acute care setting. Patient would benefit from continued skilled physical therapy to address remaining impairments and functional limitations to work towards stated goals and return to PLOF or maximal functional independence.        Follow Up  Recommendations  SNF;Supervision/Assistance - 24 hour     Equipment Recommendations   (TBD)    Recommendations for Other Services       Precautions / Restrictions Precautions Precautions: Fall Restrictions Weight Bearing Restrictions: No    Mobility  Bed Mobility Overal bed mobility: Needs Assistance Bed Mobility: Supine to Sit;Sit to Supine     Supine to sit: Mod assist Sit to supine: Max assist;+2 for physical assistance   General bed mobility comments: Patient did not want to participate in bed mobility or bed exercises but daughter encouraged PT to try counting technique that patient is familiar with at home. Patient seemed more willing to try mobilty with this encouragmenet but became unhappy that she was sitting as soon as PT helped her at the trunk and LEs to seated position. She kept her knees and hips mildly extended and was unwilling to continue scooting to edge of bed so assisted her back to reclined position. Requested assistance from 2nd person to boost her up the bed with draw sheet. RN wanted to do this without PTs help. Patient required total A for boosting up in bed and repositioning.  Transfers                    Ambulation/Gait                 Stairs             Wheelchair Mobility    Modified Rankin (Stroke Patients Only)       Balance Overall balance assessment: Needs assistance Sitting-balance support: Bilateral upper extremity supported;Feet unsupported Sitting balance-Leahy  Scale: Poor Sitting balance - Comments: continues to lean to the right and backwards. Does not flex hips or knees appropriately. Postural control: Posterior lean                                  Cognition Arousal/Alertness: Awake/alert Behavior During Therapy: WFL for tasks assessed/performed;Flat affect Overall Cognitive Status: History of cognitive impairments - at baseline                                 General  Comments: Pt appears anxious and displays tremors of UEs with all movement. Pt is resistant to attempts at [facility]      Exercises Other Exercises Other Exercises: practiced bed mobility, assisted patient to don appropriate sized socks and booties, reposition in bed, re-arrange bedsheets, educated daughter regarding care    General Comments        Pertinent Vitals/Pain Pain Assessment: Faces Faces Pain Scale: Hurts a little bit Pain Location: B feet when donning/doffing socks Pain Descriptors / Indicators: Grimacing Pain Intervention(s): Limited activity within patient's tolerance;Monitored during session;Repositioned    Home Living                      Prior Function            PT Goals (current goals can now be found in the care plan section) Acute Rehab PT Goals Patient Stated Goal: daughter hopes to be able to transition to rehab PT Goal Formulation: With patient/family Time For Goal Achievement: 10/13/20 Potential to Achieve Goals: Good Progress towards PT goals: Progressing toward goals    Frequency    Min 2X/week      PT Plan Current plan remains appropriate    Co-evaluation              AM-PAC PT "6 Clicks" Mobility   Outcome Measure  Help needed turning from your back to your side while in a flat bed without using bedrails?: A Lot Help needed moving from lying on your back to sitting on the side of a flat bed without using bedrails?: A Lot Help needed moving to and from a bed to a chair (including a wheelchair)?: Total Help needed standing up from a chair using your arms (e.g., wheelchair or bedside chair)?: Total Help needed to walk in hospital room?: Total Help needed climbing 3-5 steps with a railing? : Total 6 Click Score: 8    End of Session   Activity Tolerance: Treatment limited secondary to agitation (mental status limits ability to fully participate, patient also became agitated with attempts to assist her with mobility  today) Patient left: in bed;with call bell/phone within reach;with bed alarm set;with nursing/sitter in room;Other (comment) (with pressure wound boots re-applied.) Nurse Communication: Mobility status PT Visit Diagnosis: Muscle weakness (generalized) (M62.81);Unsteadiness on feet (R26.81)     Time: 5784-6962 PT Time Calculation (min) (ACUTE ONLY): 25 min  Charges:  $Therapeutic Activity: 23-37 mins                     Luretha Murphy. Ilsa Iha, PT, DPT 10/06/20, 7:11 PM

## 2020-10-06 NOTE — Progress Notes (Signed)
[facility] Cardiology final Hospital Encounter Note  Patient: Laurie Jackson / Admit Date: 09/28/2020 / Date of Encounter: 10/06/2020, 8:11 AM   Subjective: 1/24.  Overall condition of the patient has not significantly changed from yesterday.  Patient not conversing well at this time due to dementia and stroke.  Patient overall heart rate control is reasonable at this time with some higher heart rates at times.  As long as average heart rate to remains around 100 bpm we will continue current medical regimen to reduce the possibility of hypotension and other side effects of the medications  1/25.  Patient not conversant this a.m.  Overall heart rate control still reasonable at 103 bpm with patient being nonambulatory.  Some periods suggest possible normal rhythm and well evaluate by EKG  1/26.  Patient is alert today and conversant.  No evidence of congestive heart failure no chest pain or other anginal symptoms.  Heart rate appears to be relatively well controlled by telemetry in nonambulatory patient.  This is accomplished with current medication management of metoprolol 3 times per day.  Potentially the patient could have increased to 100 mg twice per day with reasonable blood pressure control.  Patient is tolerating anticoagulation without evidence of bleeding complication.  Review of Systems: Cannot assess  Objective: Telemetry: Atrial fibrillation with variable heart rate Physical Exam: Blood pressure 125/85, pulse (!) 101, temperature 97.8 F (36.6 C), temperature source Oral, resp. rate 19, height 5\' 7"  (1.702 m), weight 59.6 kg, SpO2 100 %. Body mass index is 20.58 kg/m. General: Well developed, well nourished, in no acute distress. Head: Normocephalic, atraumatic, sclera non-icteric, no xanthomas, nares are without discharge. Neck: No apparent masses Lungs: Normal respirations with few wheezes, some rhonchi, no rales , no crackles   Heart: Here regular rate and rhythm, normal S1  S2, no murmur, no rub, no gallop, PMI is normal size and placement, carotid upstroke normal without bruit, jugular venous pressure normal Abdomen: Soft, non-tender, non-distended with normoactive bowel sounds. No hepatosplenomegaly. Abdominal aorta is normal size without bruit Extremities: Trace edema, no clubbing, no cyanosis, + ulcers,  Peripheral: 2+ radial, 2+ femoral, 2+ dorsal pedal pulses Neuro: Not alert and oriented. Moves all extremities spontaneously. Psych: Does not responds to questions appropriately with a normal affect.   Intake/Output Summary (Last 24 hours) at 10/06/2020 0811 Last data filed at 10/06/2020 0440 Gross per 24 hour  Intake -  Output 150 ml  Net -150 ml    Inpatient Medications:  . apixaban  2.5 mg Oral BID  . donepezil  10 mg Oral Daily  . feeding supplement  237 mL Oral BID BM  . metoprolol tartrate  50 mg Oral TID  . midodrine  5 mg Oral TID WC  . multivitamin-lutein  1 capsule Oral Daily   Infusions:  . sodium chloride 250 mL (10/04/20 2108)    Labs: No results for input(s): NA, K, CL, CO2, GLUCOSE, BUN, CREATININE, CALCIUM, MG, PHOS in the last 72 hours. No results for input(s): AST, ALT, ALKPHOS, BILITOT, PROT, ALBUMIN in the last 72 hours. Recent Labs    10/04/20 0526  WBC 7.3  HGB 8.2*  HCT 24.6*  MCV 84.8  PLT 416*   No results for input(s): CKTOTAL, CKMB, TROPONINI in the last 72 hours. Invalid input(s): POCBNP No results for input(s): HGBA1C in the last 72 hours.   Weights: Filed Weights   10/04/20 0344 10/05/20 0100 10/06/20 0415  Weight: 55.4 kg 59.4 kg 59.6 kg  Radiology/Studies:  DG Chest Port 1 View  Result Date: 09/28/2020 CLINICAL DATA:  Pressure wounds EXAM: PORTABLE CHEST 1 VIEW COMPARISON:  07/23/2014 FINDINGS: The heart size and mediastinal contours are within normal limits. Both lungs are clear. The visualized skeletal structures are unremarkable. IMPRESSION: No active disease. Electronically Signed   By: Jasmine Pang M.D.   On: 09/28/2020 19:22     Assessment and Recommendation  80 y.o. female with known dementia with previous stroke hypertension hyperlipidemia anemia having acute decubitus ulcer with infection and paroxysmal nonvalvular atrial fibrillation with rapid ventricular rate without evidence of congestive heart failure or myocardial infarction now much more controlled with adjustments of medication management of metoprolol and tolerating medications well 1.  Continuation of medication management for heart rate control of atrial fibrillation with 100 mg of metoprolol twice per day with a goal heart rate between 60 and 90 bpm 2.  Anticoagulation for further risk reduction of stroke with atrial fibrillation without change 3.  Continue supportive care of ulcer 4.  No further cardiac diagnostics necessary at this time 5.  Okay for discharged home from cardiac standpoint with above treatment, and follow-up later for adjustments of medication as necessary 6.  Please call if further questions or concerns or further cardiovascular intervention needs  Signed, Arnoldo Hooker M.D. FACC

## 2020-10-06 NOTE — Progress Notes (Signed)
Nutrition Follow Up Note   DOCUMENTATION CODES:   Not applicable  INTERVENTION:   Ensure Enlive po BID, each supplement provides 350 kcal and 20 grams of protein (strawberry)  Magic cup TID with meals, each supplement provides 290 kcal and 9 grams of protein  Ocuvite daily for wound healing (provides zinc, vitamin A, vitamin C, Vitamin E, copper, and selenium)  NUTRITION DIAGNOSIS:   Increased nutrient needs related to wound healing (stage II pressure injury to sacrum x 3 poa) as evidenced by estimated needs.  GOAL:   Patient will meet greater than or equal to 90% of their needs -progressing   MONITOR:   PO intake,Supplement acceptance,Weight trends,Labs,I & O's,Skin  ASSESSMENT:   80 year old female with history of Paroxysmal atrial fibrillation on Eliquis, Alzheimer's disease and total care dependent, HTN, hx of lacunar infarct, and osteoarthritis presented on the advise of PCP for rapid worsening of skin breakdown to buttocks over the last week. Patient admitted with severe sepsis secondary to UTI and infected decubitus ulcer.   Pt continues to have fairly good appetite and oral intake in hospital; pt eating anywhere from 25-100% of meals and is drinking Ensure supplements. Pt ate 95% of her breakfast this morning which included pancake, sausage, cream of wheat and milk. Per chart, pt with 7lb weight gain since admit but appears to be back at her UBW. Pt is currently pending SNF placement for discharge.   Medications reviewed and include: ocuvite  Labs reviewed:   Diet Order:   Diet Order            DIET - DYS 1 Room service appropriate? Yes; Fluid consistency: Thin  Diet effective 1400                EDUCATION NEEDS:   Education needs have been addressed  Skin:  Skin Assessment: Reviewed RN Assessment (coccyx:  2 cm x 2 cm x0.2 cm   Left upper gluteal: 1 cm x 1.5 cm x 0.2 cm   Right upper gluteal:  0.3 cm x 0.5 cm x 0.2 cm   Right lateral: 2 cm x 2 cm eschar   Left lateral: 1.5 cm x 1 cm eschar)  Last BM:  1/26- type 5  Height:   Ht Readings from Last 1 Encounters:  09/29/20 5\' 7"  (1.702 m)    Weight:   Wt Readings from Last 1 Encounters:  10/06/20 59.6 kg    BMI:  Body mass index is 20.58 kg/m.  Estimated Nutritional Needs:   Kcal:  1600-1800kcal/day  Protein:  80-90g/day  Fluid:  1.5-1.8L/day  10/08/20 MS, RD, LDN Please refer to [facility] for RD and/or RD on-call/weekend/after hours pager

## 2020-10-06 NOTE — Progress Notes (Signed)
PROGRESS NOTE    Laurie Jackson  RFX:[id number] DOB: [birth year] DOA: 09/28/2020 PCP: Idelle Crouch, MD   Chief Complaint.  Generalized weakness Brief Narrative:  Laurie Jackson a 80 y.o.femalewith medical history significant forParoxysmal A. fib on Eliquis, Alzheimer's disease and total care dependent, hypertension, hx of lacunar infarctand osteoarthritis who presented to the emergency room on the advice of her PCP after she developed an area of skin breakdown on her buttock a week ago that has been rapidly progressing. Pt has been rapidly declining over the past 2 months and currently she needs assistance for all activities of daily living including feeding, grooming and transfers.  Patient was a found to have atrial fibrillation with rapid ventricle response while in the hospital.  She also met severe sepsis criteria due to cellulitis. Condition has been improving, currently pending nursing home placement.  Assessment & Plan:   Principal Problem:   Severe sepsis ([place]) Active Problems:   Benign essential hypertension   Chronic anemia   Mixed Alzheimer's and vascular dementia (HCC)   AKI (acute kidney injury) ([place])   Sacral decubitus ulcer   Cellulitis   AF (paroxysmal atrial fibrillation) (HCC)   Chronic anticoagulation   Functional quadriplegia (HCC)   History of lacunar cerebrovascular accident   UTI (urinary tract infection)  #1.  Severe sepsis. Left hepatic decubitus ulcer wound infection, cellulitis. E. coli urinary tract infection. Blood culture positive in 1 bottle consistent with contamination. Patient has been seen by infectious disease, antibiotics has completed. Continue wound care. Patient currently is medically stable to be discharged, pending nursing home placement.  2.  Adrenal insufficiency. Cortisol level is low, will start prednisone 2.5 mg twice a day.  #3.  Renal insufficiency. Likely due to sepsis and poor p.o. intake. Condition improved  after giving IV fluids.  4.  Paroxysmal atrial fibrillation with RVR. Continue anticoagulation. Heart rate and controlled pain  5.  Essential hypertension. Borderline blood pressure secondary to adrenal insufficiency. Patient currently on beta-blocker.  Steroids started.  #6.  Mixed Alzheimer's disease and vascular dementia. Functional quadriplegia. Continue PT and OT.  #7.  Anemia. Check iron B12 level.   DVT prophylaxis: eliquis Code Status: dnr Family Communication:  Disposition Plan:  .   Status is: Inpatient  Remains inpatient appropriate because:Unsafe d/c plan   Dispo: The patient is from: Home              Anticipated d/c is to: SNF              Anticipated d/c date is: 2 days              Patient currently is medically stable to d/c.   Difficult to place patient Yes        I/O last 3 completed shifts: In: -  Out: 150 [Urine:150] Total I/O In: 240 [P.O.:240] Out: 400 [Urine:400]     Consultants:   none  Procedures: none  Antimicrobials: none  Subjective: Patient is confused, she did not have any significant discomfort. She was eating, looks like she had a good appetite without nausea vomiting. No fever or chills. No short of breath or cough.  Objective: Vitals:   10/05/20 1954 10/06/20 0415 10/06/20 0738 10/06/20 1116  BP: 126/82 (!) 135/102 125/85 129/67  Pulse: 97 73 (!) 101 77  Resp: 17 17 19 18   Temp: 99.4 F (37.4 C) 99.2 F (37.3 C) 97.8 F (36.6 C) 98.7 F (37.1 C)  TempSrc: Oral  Oral Oral  SpO2:  100% 94% 100% 100%  Weight:  59.6 kg    Height:        Intake/Output Summary (Last 24 hours) at 10/06/2020 1513 Last data filed at 10/06/2020 1355 Gross per 24 hour  Intake 240 ml  Output 550 ml  Net -310 ml   Filed Weights   10/04/20 0344 10/05/20 0100 10/06/20 0415  Weight: 55.4 kg 59.4 kg 59.6 kg    Examination:  General exam: Appears calm and comfortable  Respiratory system: Clear to auscultation. Respiratory  effort normal. Cardiovascular system: Regular. No JVD, murmurs, rubs, gallops or clicks. No pedal edema. Gastrointestinal system: Abdomen is nondistended, soft and nontender. No organomegaly or masses felt. Normal bowel sounds heard. Central nervous system: Alert and oriented x1. No focal neurological deficits. Extremities: Symmetric 5 x 5 power. Skin: No rashes, lesions or ulcers Psychiatry:  Mood & affect appropriate.     Data Reviewed: I have personally reviewed following labs and imaging studies  CBC: Recent Labs  Lab 10/01/20 0409 10/04/20 0526  WBC 8.7 7.3  HGB 8.7* 8.2*  HCT 26.7* 24.6*  MCV 85.9 84.8  PLT 518* 628*   Basic Metabolic Panel: Recent Labs  Lab 09/30/20 0607 10/01/20 0409 10/02/20 0926  NA 140 139 142  K 3.5 3.3* 3.5  CL 107 107 112*  CO2 22 19* 20*  GLUCOSE 93 93 95  BUN 25* 23 16  CREATININE 0.88 0.87 0.77  CALCIUM 8.8* 8.8* 8.4*  MG  --  1.6*  --    GFR: Estimated Creatinine Clearance: 55.4 mL/min (by C-G formula based on SCr of 0.77 mg/dL). Liver Function Tests: No results for input(s): AST, ALT, ALKPHOS, BILITOT, PROT, ALBUMIN in the last 168 hours. No results for input(s): LIPASE, AMYLASE in the last 168 hours. No results for input(s): AMMONIA in the last 168 hours. Coagulation Profile: No results for input(s): INR, PROTIME in the last 168 hours. Cardiac Enzymes: No results for input(s): CKTOTAL, CKMB, CKMBINDEX, TROPONINI in the last 168 hours. BNP (last 3 results) No results for input(s): PROBNP in the last 8760 hours. HbA1C: No results for input(s): HGBA1C in the last 72 hours. CBG: Recent Labs  Lab 10/02/20 0811 10/02/20 1312 10/02/20 1806  GLUCAP 87 114* 89   Lipid Profile: No results for input(s): CHOL, HDL, LDLCALC, TRIG, CHOLHDL, LDLDIRECT in the last 72 hours. Thyroid Function Tests: No results for input(s): TSH, T4TOTAL, FREET4, T3FREE, THYROIDAB in the last 72 hours. Anemia Panel: No results for input(s):  VITAMINB12, FOLATE, FERRITIN, TIBC, IRON, RETICCTPCT in the last 72 hours. Sepsis Labs: Recent Labs  Lab 10/01/20 3151 10/01/20 1150 10/02/20 0926 10/02/20 1133  LATICACIDVEN 1.3 3.6* 1.2 1.6    Recent Results (from the past 240 hour(s))  Resp Panel by RT-PCR (Flu A&B, Covid) Nasopharyngeal Swab     Status: None   Collection Time: 09/28/20  6:12 PM   Specimen: Nasopharyngeal Swab; Nasopharyngeal(NP) swabs in vial transport medium  Result Value Ref Range Status   SARS Coronavirus 2 by RT PCR NEGATIVE NEGATIVE Final    Comment: (NOTE) SARS-CoV-2 target nucleic acids are NOT DETECTED.  The SARS-CoV-2 RNA is generally detectable in upper respiratory specimens during the acute phase of infection. The lowest concentration of SARS-CoV-2 viral copies this assay can detect is 138 copies/mL. A negative result does not preclude SARS-Cov-2 infection and should not be used as the sole basis for treatment or other patient management decisions. A negative result may occur with  improper specimen collection/handling, submission of specimen  other than nasopharyngeal swab, presence of viral mutation(s) within the areas targeted by this assay, and inadequate number of viral copies(<138 copies/mL). A negative result must be combined with clinical observations, patient history, and epidemiological information. The expected result is Negative.  Fact Sheet for Patients:  EntrepreneurPulse.com.au  Fact Sheet for Healthcare Providers:  IncredibleEmployment.be  This test is no t yet approved or cleared by the [place] FDA and  has been authorized for detection and/or diagnosis of SARS-CoV-2 by FDA under an Emergency Use Authorization (EUA). This EUA will remain  in effect (meaning this test can be used) for the duration of the COVID-19 declaration under Section 564(b)(1) of the Act, 21 U.S.C.section 360bbb-3(b)(1), unless the authorization is terminated  or  revoked sooner.       Influenza A by PCR NEGATIVE NEGATIVE Final   Influenza B by PCR NEGATIVE NEGATIVE Final    Comment: (NOTE) The Xpert Xpress SARS-CoV-2/FLU/RSV plus assay is intended as an aid in the diagnosis of influenza from Nasopharyngeal swab specimens and should not be used as a sole basis for treatment. Nasal washings and aspirates are unacceptable for Xpert Xpress SARS-CoV-2/FLU/RSV testing.  Fact Sheet for Patients: EntrepreneurPulse.com.au  Fact Sheet for Healthcare Providers: IncredibleEmployment.be  This test is not yet approved or cleared by the [place] FDA and has been authorized for detection and/or diagnosis of SARS-CoV-2 by FDA under an Emergency Use Authorization (EUA). This EUA will remain in effect (meaning this test can be used) for the duration of the COVID-19 declaration under Section 564(b)(1) of the Act, 21 U.S.C. section 360bbb-3(b)(1), unless the authorization is terminated or revoked.  Performed at [facility], [address] [place], [place] [address]   Blood culture (single)     Status: None   Collection Time: 09/28/20  6:12 PM   Specimen: BLOOD  Result Value Ref Range Status   Specimen Description BLOOD BLOOD RIGHT FOREARM  Final   Special Requests   Final    BOTTLES DRAWN AEROBIC AND ANAEROBIC Blood Culture adequate volume   Culture   Final    NO GROWTH 5 DAYS Performed at [facility], [address], [place], [place] [address]    Report Status 10/03/2020 FINAL  Final  Blood culture (routine single)     Status: Abnormal   Collection Time: 09/28/20  6:16 PM   Specimen: BLOOD  Result Value Ref Range Status   Specimen Description   Final    BLOOD BLOOD LEFT HAND Performed at [facility], [address], [place], [place] [address]    Special Requests   Final    BOTTLES DRAWN AEROBIC AND ANAEROBIC Blood Culture results may not be optimal due to an inadequate  volume of blood received in culture bottles Performed at [facility], [address] [place], [place] [address]    Culture  Setup Time   Final    GRAM POSITIVE COCCI IN BOTH AEROBIC AND ANAEROBIC BOTTLES CRITICAL RESULT CALLED TO, READ BACK BY AND VERIFIED WITH: KISHAN PATEL ON 09/29/20 AT 1317 QSD    Culture (A)  Final    STAPHYLOCOCCUS EPIDERMIDIS STAPHYLOCOCCUS HOMINIS THE SIGNIFICANCE OF ISOLATING THIS ORGANISM FROM A SINGLE SET OF BLOOD CULTURES WHEN MULTIPLE SETS ARE DRAWN IS UNCERTAIN. PLEASE NOTIFY THE MICROBIOLOGY DEPARTMENT WITHIN ONE WEEK IF SPECIATION AND SENSITIVITIES ARE REQUIRED. Performed at [facility], [address] [place], [place] [address]    Report Status 10/01/2020 FINAL  Final  Urine culture  Status: Abnormal   Collection Time: 09/28/20  6:16 PM   Specimen: Urine, Random  Result Value Ref Range Status   Specimen Description   Final    URINE, RANDOM Performed at [facility], [address] [place], [place] [address]    Special Requests   Final    NONE Performed at [facility], [address] [place], [place] [address]    Culture (A)  Final    >=100,000 COLONIES/mL LACTOBACILLUS SPECIES Standardized susceptibility testing for this organism is not available. 10,000 COLONIES/mL ESCHERICHIA COLI    Report Status 10/02/2020 FINAL  Final   Organism ID, Bacteria ESCHERICHIA COLI (A)  Final      Susceptibility   Escherichia coli - MIC*    AMPICILLIN <=2 SENSITIVE Sensitive     CEFAZOLIN <=4 SENSITIVE Sensitive     CEFEPIME <=0.12 SENSITIVE Sensitive     CEFTRIAXONE <=0.25 SENSITIVE Sensitive     CIPROFLOXACIN <=0.25 SENSITIVE Sensitive     GENTAMICIN <=1 SENSITIVE Sensitive     IMIPENEM <=0.25 SENSITIVE Sensitive     NITROFURANTOIN <=16 SENSITIVE Sensitive     TRIMETH/SULFA <=20 SENSITIVE Sensitive     AMPICILLIN/SULBACTAM <=2 SENSITIVE Sensitive     PIP/TAZO <=4 SENSITIVE Sensitive     * 10,000 COLONIES/mL  ESCHERICHIA COLI  Blood Culture ID Panel (Reflexed)     Status: Abnormal   Collection Time: 09/28/20  6:16 PM  Result Value Ref Range Status   Enterococcus faecalis NOT DETECTED NOT DETECTED Final   Enterococcus Faecium NOT DETECTED NOT DETECTED Final   Listeria monocytogenes NOT DETECTED NOT DETECTED Final   Staphylococcus species DETECTED (A) NOT DETECTED Final    Comment: CRITICAL RESULT CALLED TO, READ BACK BY AND VERIFIED WITH: KISHAN PATEL ON 09/29/20 AT 1317 QSD    Staphylococcus aureus (BCID) NOT DETECTED NOT DETECTED Final   Staphylococcus epidermidis DETECTED (A) NOT DETECTED Final    Comment: CRITICAL RESULT CALLED TO, READ BACK BY AND VERIFIED WITH: KISHAN PATEL ON 09/29/20 AT 1317 QSD    Staphylococcus lugdunensis NOT DETECTED NOT DETECTED Final   Streptococcus species NOT DETECTED NOT DETECTED Final   Streptococcus agalactiae NOT DETECTED NOT DETECTED Final   Streptococcus pneumoniae NOT DETECTED NOT DETECTED Final   Streptococcus pyogenes NOT DETECTED NOT DETECTED Final   A.calcoaceticus-baumannii NOT DETECTED NOT DETECTED Final   Bacteroides fragilis NOT DETECTED NOT DETECTED Final   Enterobacterales NOT DETECTED NOT DETECTED Final   Enterobacter cloacae complex NOT DETECTED NOT DETECTED Final   Escherichia coli NOT DETECTED NOT DETECTED Final   Klebsiella aerogenes NOT DETECTED NOT DETECTED Final   Klebsiella oxytoca NOT DETECTED NOT DETECTED Final   Klebsiella pneumoniae NOT DETECTED NOT DETECTED Final   Proteus species NOT DETECTED NOT DETECTED Final   Salmonella species NOT DETECTED NOT DETECTED Final   Serratia marcescens NOT DETECTED NOT DETECTED Final   Haemophilus influenzae NOT DETECTED NOT DETECTED Final   Neisseria meningitidis NOT DETECTED NOT DETECTED Final   Pseudomonas aeruginosa NOT DETECTED NOT DETECTED Final   Stenotrophomonas maltophilia NOT DETECTED NOT DETECTED Final   Candida albicans NOT DETECTED NOT DETECTED Final   Candida auris NOT DETECTED  NOT DETECTED Final   Candida glabrata NOT DETECTED NOT DETECTED Final   Candida krusei NOT DETECTED NOT DETECTED Final   Candida parapsilosis NOT DETECTED NOT DETECTED Final   Candida tropicalis NOT DETECTED NOT DETECTED Final   Cryptococcus neoformans/gattii NOT DETECTED NOT DETECTED Final   Methicillin resistance mecA/C NOT DETECTED NOT  DETECTED Final    Comment: Performed at [facility], [address] [place], [place] [address]  CULTURE, BLOOD (ROUTINE X 2) w Reflex to ID Panel     Status: None   Collection Time: 09/30/20  4:13 PM   Specimen: BLOOD  Result Value Ref Range Status   Specimen Description BLOOD LEFT ANTECUBITAL  Final   Special Requests   Final    BOTTLES DRAWN AEROBIC AND ANAEROBIC Blood Culture adequate volume   Culture   Final    NO GROWTH 5 DAYS Performed at [facility], [address] [place], [place] [address]    Report Status 10/05/2020 FINAL  Final  CULTURE, BLOOD (ROUTINE X 2) w Reflex to ID Panel     Status: None   Collection Time: 09/30/20  4:13 PM   Specimen: BLOOD  Result Value Ref Range Status   Specimen Description BLOOD BLOOD LEFT HAND  Final   Special Requests   Final    BOTTLES DRAWN AEROBIC AND ANAEROBIC Blood Culture adequate volume   Culture   Final    NO GROWTH 5 DAYS Performed at [facility], [address] [place],  [address]    Report Status 10/05/2020 FINAL  Final         Radiology Studies: No results found.      Scheduled Meds: . apixaban  2.5 mg Oral BID  . donepezil  10 mg Oral Daily  . feeding supplement  237 mL Oral BID BM  . metoprolol tartrate  100 mg Oral BID  . midodrine  5 mg Oral TID WC  . multivitamin-lutein  1 capsule Oral Daily  . predniSONE  2.5 mg Oral BID WC   Continuous Infusions: . sodium chloride 250 mL (10/04/20 2108)     LOS: 8 days    Time spent: 28 minutes    Sharen Hones, MD [facility]   To contact the attending provider between  7A-7P or the covering provider during after hours 7P-7A, please log into the web site [email or web address] and access using universal [place] password for that web site. If you do not have the password, please call the hospital operator.  10/06/2020, 3:13 PM

## 2020-10-07 DIAGNOSIS — N3000 Acute cystitis without hematuria: Secondary | ICD-10-CM

## 2020-10-07 LAB — BASIC METABOLIC PANEL
Anion gap: 11 (ref 5–15)
BUN: 18 mg/dL (ref 8–23)
CO2: 19 mmol/L — ABNORMAL LOW (ref 22–32)
Calcium: 8.9 mg/dL (ref 8.9–10.3)
Chloride: 109 mmol/L (ref 98–111)
Creatinine, Ser: 0.59 mg/dL (ref 0.44–1.00)
GFR, Estimated: >60 mL/min (ref >60)
Glucose, Bld: 98 mg/dL (ref 70–99)
Potassium: 4.2 mmol/L (ref 3.5–5.1)
Sodium: 139 mmol/L (ref 135–145)

## 2020-10-07 LAB — CBC WITH DIFFERENTIAL/PLATELET
Abs Immature Granulocytes: 0.18 10*3/uL — ABNORMAL HIGH (ref 0.00–0.07)
Basophils Absolute: 0.1 10*3/uL (ref 0.0–0.1)
Basophils Relative: 1 %
Eosinophils Absolute: 0.1 10*3/uL (ref 0.0–0.5)
Eosinophils Relative: 1 %
HCT: 26.3 % — ABNORMAL LOW (ref 36.0–46.0)
Hemoglobin: 8.4 g/dL — ABNORMAL LOW (ref 12.0–15.0)
Immature Granulocytes: 2 %
Lymphocytes Relative: 26 %
Lymphs Abs: 2.3 10*3/uL (ref 0.7–4.0)
MCH: 27.7 pg (ref 26.0–34.0)
MCHC: 31.9 g/dL (ref 30.0–36.0)
MCV: 86.8 fL (ref 80.0–100.0)
Monocytes Absolute: 0.7 10*3/uL (ref 0.1–1.0)
Monocytes Relative: 7 %
Neutro Abs: 5.5 10*3/uL (ref 1.7–7.7)
Neutrophils Relative %: 63 %
Platelets: 428 10*3/uL — ABNORMAL HIGH (ref 150–400)
RBC: 3.03 MIL/uL — ABNORMAL LOW (ref 3.87–5.11)
RDW: 15.4 % (ref 11.5–15.5)
WBC: 8.8 10*3/uL (ref 4.0–10.5)
nRBC: 0 % (ref 0.0–0.2)

## 2020-10-07 LAB — MAGNESIUM: Magnesium: 1.9 mg/dL (ref 1.7–2.4)

## 2020-10-07 LAB — ACTH: C206 ACTH: 13.5 pg/mL (ref 7.2–63.3)

## 2020-10-07 LAB — GLUCOSE, CAPILLARY: Glucose-Capillary: 123 mg/dL — ABNORMAL HIGH (ref 70–99)

## 2020-10-07 LAB — SARS CORONAVIRUS 2 BY RT PCR (HOSPITAL ORDER, PERFORMED IN ~~LOC~~ HOSPITAL LAB): SARS Coronavirus 2: NEGATIVE

## 2020-10-07 NOTE — Progress Notes (Signed)
PROGRESS NOTE    Laurie Jackson  XIH:[id number] DOB: 01-23-1941 DOA: 09/28/2020 PCP: Laurie Crouch, MD   Chief complaint.  Generalized weakness. Brief Narrative:  Laurie Jackson a 80 y.o.femalewith medical history significant forParoxysmal A. fib on Eliquis, Alzheimer's disease and total care dependent, hypertension, hx of lacunar infarctand osteoarthritis who presented to the emergency room on the advice of her PCP after she developed an area of skin breakdown on her buttock a week ago that has been rapidly progressing.Pt has beenrapidly declining over the past 2 months and currently she needs assistance for all activities of daily living including feeding, grooming and transfers.  Patient was a found to have atrial fibrillation with rapid ventricle response while in the hospital.  She also met severe sepsis criteria due to cellulitis. Condition has been improving, currently pending nursing home placement.   Assessment & Plan:   Principal Problem:   Severe sepsis ([place]) Active Problems:   Benign essential hypertension   Chronic anemia   Mixed Alzheimer's and vascular dementia (HCC)   AKI (acute kidney injury) ([place])   Sacral decubitus ulcer   Cellulitis   AF (paroxysmal atrial fibrillation) (HCC)   Chronic anticoagulation   Functional quadriplegia (HCC)   History of lacunar cerebrovascular accident   UTI (urinary tract infection)  #1.  Severe sepsis secondary to E. coli urinary tract infection. E. coli urinary tract infection. Left decubitus ulcer wound infection.  Cellulitis. Condition had improved.  Antibiotics completed.  2.  Adrenal insufficiency. Continue prednisone twice a day.  3.  Acute kidney failure. Renal function had improved.  4.  Anemia of chronic disease. Adequate iron B12 level.      DVT prophylaxis: Eliquis Code Status: DNR Family Communication:  Disposition Plan:  .   Status is: Inpatient  Remains inpatient appropriate  because:Unsafe d/c plan   Dispo: The patient is from: Home              Anticipated d/c is to: SNF              Anticipated d/c date is: 1 day              Patient currently is medically stable to d/c.   Difficult to place patient No        I/O last 3 completed shifts: In: 30 [P.O.:480] Out: 750 [Urine:750] Total I/O In: 120 [P.O.:120] Out: 0      Consultants:   Cardiology  Procedures: None  Antimicrobials: None  Subjective: Patient has some baseline confusion, appetite is better.  No nausea vomiting or abdominal pain. No fever or chills. No dysuria hematuria.  Objective: Vitals:   10/07/20 0025 10/07/20 0329 10/07/20 0736 10/07/20 1251  BP:  121/70 125/78 (!) 153/98  Pulse: 91 66 68 82  Resp:  19 15 16   Temp:  98.5 F (36.9 C) 99 F (37.2 C) (!) 97.5 F (36.4 C)  TempSrc:  Oral Oral Oral  SpO2: 100% 100% 100% 99%  Weight:      Height:        Intake/Output Summary (Last 24 hours) at 10/07/2020 1429 Last data filed at 10/07/2020 0950 Gross per 24 hour  Intake 120 ml  Output 200 ml  Net -80 ml   Filed Weights   10/04/20 0344 10/05/20 0100 10/06/20 0415  Weight: 55.4 kg 59.4 kg 59.6 kg    Examination:  General exam: Appears calm and comfortable  Respiratory system: Clear to auscultation. Respiratory effort normal. Cardiovascular system: S1 & S2 heard,  RRR. No JVD, murmurs, rubs, gallops or clicks. No pedal edema. Gastrointestinal system: Abdomen is nondistended, soft and nontender. No organomegaly or masses felt. Normal bowel sounds heard. Central nervous system: Alert and oriented x1. No focal neurological deficits. Extremities: Symmetric 5 x 5 power. Skin: No rashes, lesions or ulcers Psychiatry: Mood & affect appropriate.     Data Reviewed: I have personally reviewed following labs and imaging studies  CBC: Recent Labs  Lab 10/01/20 0409 10/04/20 0526 10/07/20 0427  WBC 8.7 7.3 8.8  NEUTROABS  --   --  5.5  HGB 8.7* 8.2* 8.4*  HCT  26.7* 24.6* 26.3*  MCV 85.9 84.8 86.8  PLT 518* 416* 761*   Basic Metabolic Panel: Recent Labs  Lab 10/01/20 0409 10/02/20 0926 10/07/20 0427  NA 139 142 139  K 3.3* 3.5 4.2  CL 107 112* 109  CO2 19* 20* 19*  GLUCOSE 93 95 98  BUN 23 16 18   CREATININE 0.87 0.77 0.59  CALCIUM 8.8* 8.4* 8.9  MG 1.6*  --  1.9   GFR: Estimated Creatinine Clearance: 55.4 mL/min (by C-G formula based on SCr of 0.59 mg/dL). Liver Function Tests: No results for input(s): AST, ALT, ALKPHOS, BILITOT, PROT, ALBUMIN in the last 168 hours. No results for input(s): LIPASE, AMYLASE in the last 168 hours. No results for input(s): AMMONIA in the last 168 hours. Coagulation Profile: No results for input(s): INR, PROTIME in the last 168 hours. Cardiac Enzymes: No results for input(s): CKTOTAL, CKMB, CKMBINDEX, TROPONINI in the last 168 hours. BNP (last 3 results) No results for input(s): PROBNP in the last 8760 hours. HbA1C: No results for input(s): HGBA1C in the last 72 hours. CBG: Recent Labs  Lab 10/02/20 0811 10/02/20 1312 10/02/20 1806 10/07/20 0016  GLUCAP 87 114* 89 123*   Lipid Profile: No results for input(s): CHOL, HDL, LDLCALC, TRIG, CHOLHDL, LDLDIRECT in the last 72 hours. Thyroid Function Tests: No results for input(s): TSH, T4TOTAL, FREET4, T3FREE, THYROIDAB in the last 72 hours. Anemia Panel: Recent Labs    10/06/20 1537  VITAMINB12 617  TIBC 189*  IRON 44   Sepsis Labs: Recent Labs  Lab 10/01/20 0917 10/01/20 1150 10/02/20 0926 10/02/20 1133  LATICACIDVEN 1.3 3.6* 1.2 1.6    Recent Results (from the past 240 hour(s))  Resp Panel by RT-PCR (Flu A&B, Covid) Nasopharyngeal Swab     Status: None   Collection Time: 09/28/20  6:12 PM   Specimen: Nasopharyngeal Swab; Nasopharyngeal(NP) swabs in vial transport medium  Result Value Ref Range Status   SARS Coronavirus 2 by RT PCR NEGATIVE NEGATIVE Final    Comment: (NOTE) SARS-CoV-2 target nucleic acids are NOT  DETECTED.  The SARS-CoV-2 RNA is generally detectable in upper respiratory specimens during the acute phase of infection. The lowest concentration of SARS-CoV-2 viral copies this assay can detect is 138 copies/mL. A negative result does not preclude SARS-Cov-2 infection and should not be used as the sole basis for treatment or other patient management decisions. A negative result may occur with  improper specimen collection/handling, submission of specimen other than nasopharyngeal swab, presence of viral mutation(s) within the areas targeted by this assay, and inadequate number of viral copies(<138 copies/mL). A negative result must be combined with clinical observations, patient history, and epidemiological information. The expected result is Negative.  Fact Sheet for Patients:  EntrepreneurPulse.com.au  Fact Sheet for Healthcare Providers:  IncredibleEmployment.be  This test is no t yet approved or cleared by the [place] and  has been authorized for detection and/or diagnosis of SARS-CoV-2 by FDA under an Emergency Use Authorization (EUA). This EUA will remain  in effect (meaning this test can be used) for the duration of the COVID-19 declaration under Section 564(b)(1) of the Act, 21 U.S.C.section 360bbb-3(b)(1), unless the authorization is terminated  or revoked sooner.       Influenza A by PCR NEGATIVE NEGATIVE Final   Influenza B by PCR NEGATIVE NEGATIVE Final    Comment: (NOTE) The Xpert Xpress SARS-CoV-2/FLU/RSV plus assay is intended as an aid in the diagnosis of influenza from Nasopharyngeal swab specimens and should not be used as a sole basis for treatment. Nasal washings and aspirates are unacceptable for Xpert Xpress SARS-CoV-2/FLU/RSV testing.  Fact Sheet for Patients: EntrepreneurPulse.com.au  Fact Sheet for Healthcare Providers: IncredibleEmployment.be  This test is not yet  approved or cleared by the [place] FDA and has been authorized for detection and/or diagnosis of SARS-CoV-2 by FDA under an Emergency Use Authorization (EUA). This EUA will remain in effect (meaning this test can be used) for the duration of the COVID-19 declaration under Section 564(b)(1) of the Act, 21 U.S.C. section 360bbb-3(b)(1), unless the authorization is terminated or revoked.  Performed at [facility], [address] [place], [place] [address]   Blood culture (single)     Status: None   Collection Time: 09/28/20  6:12 PM   Specimen: BLOOD  Result Value Ref Range Status   Specimen Description BLOOD BLOOD RIGHT FOREARM  Final   Special Requests   Final    BOTTLES DRAWN AEROBIC AND ANAEROBIC Blood Culture adequate volume   Culture   Final    NO GROWTH 5 DAYS Performed at [facility], [address] [place], [place] [address]    Report Status 10/03/2020 FINAL  Final  Blood culture (routine single)     Status: Abnormal   Collection Time: 09/28/20  6:16 PM   Specimen: BLOOD  Result Value Ref Range Status   Specimen Description   Final    BLOOD BLOOD LEFT HAND Performed at [facility], [address] [place], [place] [address]    Special Requests   Final    BOTTLES DRAWN AEROBIC AND ANAEROBIC Blood Culture results may not be optimal due to an inadequate volume of blood received in culture bottles Performed at [facility], [address] [place], [place] [address]    Culture  Setup Time   Final    GRAM POSITIVE COCCI IN BOTH AEROBIC AND ANAEROBIC BOTTLES CRITICAL RESULT CALLED TO, READ BACK BY AND VERIFIED WITH: KISHAN PATEL ON 09/29/20 AT 1317 QSD    Culture (A)  Final    STAPHYLOCOCCUS EPIDERMIDIS STAPHYLOCOCCUS HOMINIS THE SIGNIFICANCE OF ISOLATING THIS ORGANISM FROM A SINGLE SET OF BLOOD CULTURES WHEN MULTIPLE SETS ARE DRAWN IS UNCERTAIN. PLEASE NOTIFY THE MICROBIOLOGY DEPARTMENT WITHIN ONE WEEK IF SPECIATION AND  SENSITIVITIES ARE REQUIRED. Performed at [facility], [place] [address] [place], [place] [address]    Report Status 10/01/2020 FINAL  Final  Urine culture     Status: Abnormal   Collection Time: 09/28/20  6:16 PM   Specimen: Urine, Random  Result Value Ref Range Status   Specimen Description   Final    URINE, RANDOM Performed at [facility], [address] [place], [place] [address]    Special Requests   Final    NONE Performed at [facility], [address] [place], [place] [address]  Culture (A)  Final    >=100,000 COLONIES/mL LACTOBACILLUS SPECIES Standardized susceptibility testing for this organism is not available. 10,000 COLONIES/mL ESCHERICHIA COLI    Report Status 10/02/2020 FINAL  Final   Organism ID, Bacteria ESCHERICHIA COLI (A)  Final      Susceptibility   Escherichia coli - MIC*    AMPICILLIN <=2 SENSITIVE Sensitive     CEFAZOLIN <=4 SENSITIVE Sensitive     CEFEPIME <=0.12 SENSITIVE Sensitive     CEFTRIAXONE <=0.25 SENSITIVE Sensitive     CIPROFLOXACIN <=0.25 SENSITIVE Sensitive     GENTAMICIN <=1 SENSITIVE Sensitive     IMIPENEM <=0.25 SENSITIVE Sensitive     NITROFURANTOIN <=16 SENSITIVE Sensitive     TRIMETH/SULFA <=20 SENSITIVE Sensitive     AMPICILLIN/SULBACTAM <=2 SENSITIVE Sensitive     PIP/TAZO <=4 SENSITIVE Sensitive     * 10,000 COLONIES/mL ESCHERICHIA COLI  Blood Culture ID Panel (Reflexed)     Status: Abnormal   Collection Time: 09/28/20  6:16 PM  Result Value Ref Range Status   Enterococcus faecalis NOT DETECTED NOT DETECTED Final   Enterococcus Faecium NOT DETECTED NOT DETECTED Final   Listeria monocytogenes NOT DETECTED NOT DETECTED Final   Staphylococcus species DETECTED (A) NOT DETECTED Final    Comment: CRITICAL RESULT CALLED TO, READ BACK BY AND VERIFIED WITH: KISHAN PATEL ON 09/29/20 AT 1317 QSD    Staphylococcus aureus (BCID) NOT DETECTED NOT DETECTED Final   Staphylococcus epidermidis DETECTED (A) NOT  DETECTED Final    Comment: CRITICAL RESULT CALLED TO, READ BACK BY AND VERIFIED WITH: KISHAN PATEL ON 09/29/20 AT 1317 QSD    Staphylococcus lugdunensis NOT DETECTED NOT DETECTED Final   Streptococcus species NOT DETECTED NOT DETECTED Final   Streptococcus agalactiae NOT DETECTED NOT DETECTED Final   Streptococcus pneumoniae NOT DETECTED NOT DETECTED Final   Streptococcus pyogenes NOT DETECTED NOT DETECTED Final   A.calcoaceticus-baumannii NOT DETECTED NOT DETECTED Final   Bacteroides fragilis NOT DETECTED NOT DETECTED Final   Enterobacterales NOT DETECTED NOT DETECTED Final   Enterobacter cloacae complex NOT DETECTED NOT DETECTED Final   Escherichia coli NOT DETECTED NOT DETECTED Final   Klebsiella aerogenes NOT DETECTED NOT DETECTED Final   Klebsiella oxytoca NOT DETECTED NOT DETECTED Final   Klebsiella pneumoniae NOT DETECTED NOT DETECTED Final   Proteus species NOT DETECTED NOT DETECTED Final   Salmonella species NOT DETECTED NOT DETECTED Final   Serratia marcescens NOT DETECTED NOT DETECTED Final   Haemophilus influenzae NOT DETECTED NOT DETECTED Final   Neisseria meningitidis NOT DETECTED NOT DETECTED Final   Pseudomonas aeruginosa NOT DETECTED NOT DETECTED Final   Stenotrophomonas maltophilia NOT DETECTED NOT DETECTED Final   Candida albicans NOT DETECTED NOT DETECTED Final   Candida auris NOT DETECTED NOT DETECTED Final   Candida glabrata NOT DETECTED NOT DETECTED Final   Candida krusei NOT DETECTED NOT DETECTED Final   Candida parapsilosis NOT DETECTED NOT DETECTED Final   Candida tropicalis NOT DETECTED NOT DETECTED Final   Cryptococcus neoformans/gattii NOT DETECTED NOT DETECTED Final   Methicillin resistance mecA/C NOT DETECTED NOT DETECTED Final    Comment: Performed at [facility], [address] [place], [place] [address]  CULTURE, BLOOD (ROUTINE X 2) w Reflex to ID Panel     Status: None   Collection Time: 09/30/20  4:13 PM   Specimen: BLOOD  Result  Value Ref Range Status   Specimen Description BLOOD LEFT ANTECUBITAL  Final   Special Requests   Final  BOTTLES DRAWN AEROBIC AND ANAEROBIC Blood Culture adequate volume   Culture   Final    NO GROWTH 5 DAYS Performed at [facility], [address] [place], [place] [address]    Report Status 10/05/2020 FINAL  Final  CULTURE, BLOOD (ROUTINE X 2) w Reflex to ID Panel     Status: None   Collection Time: 09/30/20  4:13 PM   Specimen: BLOOD  Result Value Ref Range Status   Specimen Description BLOOD BLOOD LEFT HAND  Final   Special Requests   Final    BOTTLES DRAWN AEROBIC AND ANAEROBIC Blood Culture adequate volume   Culture   Final    NO GROWTH 5 DAYS Performed at [facility], [address] [place], [place] [address]    Report Status 10/05/2020 FINAL  Final         Radiology Studies: No results found.      Scheduled Meds: . apixaban  2.5 mg Oral BID  . donepezil  10 mg Oral Daily  . feeding supplement  237 mL Oral BID BM  . metoprolol tartrate  100 mg Oral BID  . multivitamin-lutein  1 capsule Oral Daily  . predniSONE  2.5 mg Oral BID WC   Continuous Infusions: . sodium chloride 250 mL (10/04/20 2108)     LOS: 9 days    Time spent: 28 minutes    Sharen Hones, MD [facility]   To contact the attending provider between 7A-7P or the covering provider during after hours 7P-7A, please log into the web site [email or web address] and access using universal [place] password for that web site. If you do not have the password, please call the hospital operator.  10/07/2020, 2:29 PM

## 2020-10-07 NOTE — Progress Notes (Signed)
Patient confused, oriented only to self. Patient often refuses turns once she gets in a position that she likes. Meds crushed in apple sauce. Bladder scanning Q4 hours. No new changes overnight. Fall precautions maintained, call bell within reach. Will continue with the current plan of care.

## 2020-10-07 NOTE — Care Management Important Message (Signed)
Important Message  Patient Details  Name: Laurie Jackson MRN: [id number] Date of Birth: Feb 01, 1941   Medicare Important Message Given:  Yes     [name] 10/07/2020, 1:53 PM

## 2020-10-07 NOTE — TOC Progression Note (Addendum)
Transition of Care [facility]) - Progression Note    Patient Details  Name: Laurie Jackson MRN: [id number] Date of Birth: 1941/07/23  Transition of Care [facility]) CM/SW Contact  Hetty Ely, RN Phone Number: 10/07/2020, 4:03 PM  Clinical Narrative:  Received bed acceptance from [facility] yesterday,family notified and did a tour. Daughter Doyne Keel text me to proceed with the bed offer. Called and spoke with Gavin Pound, Admissions Coordinator, who informed me she would get back with me. Called again this afternoon,left voice message to return call, no return call will call again tomorrow morning.   [facility] Gavin Pound did return call with approval for admission. Attending notified, to get COVID test this evening for discharge in the morning.         Expected Discharge Plan and Services                                                 Social Determinants of Health (SDOH) Interventions    Readmission Risk Interventions No flowsheet data found.

## 2020-10-08 DIAGNOSIS — E039 Hypothyroidism, unspecified: Secondary | ICD-10-CM | POA: Diagnosis not present

## 2020-10-08 DIAGNOSIS — E559 Vitamin D deficiency, unspecified: Secondary | ICD-10-CM | POA: Diagnosis not present

## 2020-10-08 DIAGNOSIS — Z515 Encounter for palliative care: Secondary | ICD-10-CM | POA: Diagnosis not present

## 2020-10-08 DIAGNOSIS — L89620 Pressure ulcer of left heel, unstageable: Secondary | ICD-10-CM | POA: Diagnosis not present

## 2020-10-08 DIAGNOSIS — N179 Acute kidney failure, unspecified: Secondary | ICD-10-CM | POA: Diagnosis not present

## 2020-10-08 DIAGNOSIS — L89610 Pressure ulcer of right heel, unstageable: Secondary | ICD-10-CM | POA: Diagnosis not present

## 2020-10-08 DIAGNOSIS — I10 Essential (primary) hypertension: Secondary | ICD-10-CM | POA: Diagnosis not present

## 2020-10-08 DIAGNOSIS — M1711 Unilateral primary osteoarthritis, right knee: Secondary | ICD-10-CM | POA: Diagnosis not present

## 2020-10-08 DIAGNOSIS — D509 Iron deficiency anemia, unspecified: Secondary | ICD-10-CM | POA: Diagnosis not present

## 2020-10-08 DIAGNOSIS — M6281 Muscle weakness (generalized): Secondary | ICD-10-CM | POA: Diagnosis not present

## 2020-10-08 DIAGNOSIS — R739 Hyperglycemia, unspecified: Secondary | ICD-10-CM | POA: Diagnosis not present

## 2020-10-08 DIAGNOSIS — G309 Alzheimer's disease, unspecified: Secondary | ICD-10-CM | POA: Diagnosis not present

## 2020-10-08 DIAGNOSIS — D519 Vitamin B12 deficiency anemia, unspecified: Secondary | ICD-10-CM | POA: Diagnosis not present

## 2020-10-08 DIAGNOSIS — R5381 Other malaise: Secondary | ICD-10-CM | POA: Diagnosis not present

## 2020-10-08 DIAGNOSIS — E271 Primary adrenocortical insufficiency: Secondary | ICD-10-CM | POA: Diagnosis not present

## 2020-10-08 DIAGNOSIS — R532 Functional quadriplegia: Secondary | ICD-10-CM | POA: Diagnosis not present

## 2020-10-08 DIAGNOSIS — I480 Paroxysmal atrial fibrillation: Secondary | ICD-10-CM | POA: Diagnosis not present

## 2020-10-08 DIAGNOSIS — R6520 Severe sepsis without septic shock: Secondary | ICD-10-CM | POA: Diagnosis not present

## 2020-10-08 DIAGNOSIS — L03317 Cellulitis of buttock: Secondary | ICD-10-CM | POA: Diagnosis not present

## 2020-10-08 DIAGNOSIS — D638 Anemia in other chronic diseases classified elsewhere: Secondary | ICD-10-CM | POA: Diagnosis not present

## 2020-10-08 DIAGNOSIS — B9620 Unspecified Escherichia coli [E. coli] as the cause of diseases classified elsewhere: Secondary | ICD-10-CM | POA: Diagnosis not present

## 2020-10-08 DIAGNOSIS — F0150 Vascular dementia without behavioral disturbance: Secondary | ICD-10-CM | POA: Diagnosis not present

## 2020-10-08 DIAGNOSIS — A419 Sepsis, unspecified organism: Secondary | ICD-10-CM | POA: Diagnosis not present

## 2020-10-08 DIAGNOSIS — L89152 Pressure ulcer of sacral region, stage 2: Secondary | ICD-10-CM | POA: Diagnosis not present

## 2020-10-08 DIAGNOSIS — N3000 Acute cystitis without hematuria: Secondary | ICD-10-CM | POA: Diagnosis not present

## 2020-10-08 DIAGNOSIS — F0280 Dementia in other diseases classified elsewhere without behavioral disturbance: Secondary | ICD-10-CM | POA: Diagnosis not present

## 2020-10-08 DIAGNOSIS — L8992 Pressure ulcer of unspecified site, stage 2: Secondary | ICD-10-CM | POA: Diagnosis not present

## 2020-10-08 DIAGNOSIS — R279 Unspecified lack of coordination: Secondary | ICD-10-CM | POA: Diagnosis not present

## 2020-10-08 DIAGNOSIS — N390 Urinary tract infection, site not specified: Secondary | ICD-10-CM | POA: Diagnosis not present

## 2020-10-08 MED ORDER — ENSURE ENLIVE PO LIQD: 237.0000 mL | Freq: Two times a day (BID) | ORAL | 0 refills | Status: DC

## 2020-10-08 MED ORDER — PREDNISONE 2.5 MG PO TABS: 2.5000 mg | ORAL_TABLET | Freq: Two times a day (BID) | ORAL | Status: DC

## 2020-10-08 MED ORDER — METOPROLOL TARTRATE 100 MG PO TABS: 100.0000 mg | ORAL_TABLET | Freq: Two times a day (BID) | ORAL | 0 refills | Status: DC

## 2020-10-08 NOTE — Discharge Summary (Signed)
Physician Discharge Summary  Patient ID: Kwanza Cancelliere MRN: [id number] DOB/AGE: [birth year] 78 y.o.  Admit date: 09/28/2020 Discharge date: 10/08/2020  Admission Diagnoses:  Discharge Diagnoses:  Principal Problem:   Severe sepsis [facility]) Active Problems:   Benign essential hypertension   Chronic anemia   Mixed Alzheimer's and vascular dementia (HCC)   AKI (acute kidney injury) ([place])   Sacral decubitus ulcer   Cellulitis   AF (paroxysmal atrial fibrillation) (HCC)   Chronic anticoagulation   Functional quadriplegia (HCC)   History of lacunar cerebrovascular accident   UTI (urinary tract infection)   Discharged Condition: fair  Hospital Course:  Mairen Wallenstein a 80 y.o.femalewith medical history significant forParoxysmal A. fib on Eliquis, Alzheimer's disease and total care dependent, hypertension, hx of lacunar infarctand osteoarthritis who presented to the emergency room on the advice of her PCP after she developed an area of skin breakdown on her buttock a week ago that has been rapidly progressing.Pt has beenrapidly declining over the past 2 months and currently she needs assistance for all activities of daily living including feeding, grooming and transfers. Patient was a found to have atrial fibrillation with rapid ventricle response while in the hospital. She also met severe sepsis criteria due to cellulitis. Condition has been improving, currently pending nursing home placement. Due to patient multiple medical problems and dementia, she will be referred to palliative care after discharge from hospital.  #1.  Severe sepsis secondary to E. coli urinary tract infection. E. coli urinary tract infection. Left decubitus ulcer wound infection.  Cellulitis. Condition had improved.  Antibiotics completed.  2.  Adrenal insufficiency. Continue prednisone twice a day.  3.  Acute kidney failure. Renal function had improved.  4.  Anemia of chronic  disease. Adequate iron B12 level.  5. Pressure ulcers POA as below. Follow with Wound care.  Pressure Injury 09/29/20 Sacrum Stage 2 -  Partial thickness loss of dermis presenting as a shallow open injury with a red, pink wound bed without slough. Three areas of stage 2 pressure wounds on sacrum (Active)  09/29/20 0302  Location: Sacrum  Location Orientation:   Staging: Stage 2 -  Partial thickness loss of dermis presenting as a shallow open injury with a red, pink wound bed without slough.  Wound Description (Comments): Three areas of stage 2 pressure wounds on sacrum  Present on Admission: Yes     Pressure Injury 10/05/20 Heel Left;Right Unstageable - Full thickness tissue loss in which the base of the injury is covered by slough (yellow, tan, gray, green or brown) and/or eschar (tan, brown or black) in the wound bed. (Active)  10/05/20 0730  Location: Heel  Location Orientation: Left;Right  Staging: Unstageable - Full thickness tissue loss in which the base of the injury is covered by slough (yellow, tan, gray, green or brown) and/or eschar (tan, brown or black) in the wound bed.  Wound Description (Comments):   Present on Admission: Yes         Consults: None  Significant Diagnostic Studies:   Treatments: Antibiotics  Discharge Exam: Blood pressure (!) 150/97, pulse 95, temperature 98.7 F (37.1 C), temperature source Oral, resp. rate 17, height 5' 7" (1.702 m), weight 58.6 kg, SpO2 100 %. General appearance: alert, cooperative and oreitned to herself Resp: clear to auscultation bilaterally Cardio: regular rate and rhythm, S1, S2 normal, no murmur, click, rub or gallop GI: soft, non-tender; bowel sounds normal; no masses,  no organomegaly Extremities: extremities normal, atraumatic, no cyanosis or edema  Disposition: Discharge  disposition: 03-Skilled Nursing Facility       Discharge Instructions    AMB referral to wound care center   Complete by: As directed     Amb Referral to Palliative Care   Complete by: As directed    Diet - low sodium heart healthy   Complete by: As directed    Discharge wound care:   Complete by: As directed    Refer to wc and follow by RN   Increase activity slowly   Complete by: As directed      Allergies as of 10/08/2020      Reactions   Penicillin G Other (See Comments)   Drunk feeling/weak      Medication List    STOP taking these medications   losartan-hydrochlorothiazide 100-25 MG tablet Commonly known as: HYZAAR   propranolol 20 MG tablet Commonly known as: INDERAL     TAKE these medications   apixaban 2.5 MG Tabs tablet Commonly known as: ELIQUIS TAKE 1 TABLET (2.5 MG TOTAL) BY MOUTH EVERY 12 (TWELVE) HOURS   diclofenac Sodium 1 % Gel Commonly known as: VOLTAREN Apply 2 g topically 4 (four) times daily.   donepezil 10 MG tablet Commonly known as: ARICEPT Take 10 mg by mouth daily.   feeding supplement Liqd Take 237 mLs by mouth 2 (two) times daily between meals.   ferrous sulfate 325 (65 FE) MG tablet Take 325 mg by mouth 2 (two) times daily.   megestrol 40 MG/ML suspension Commonly known as: MEGACE Take 10 mLs by mouth daily.   metoprolol tartrate 100 MG tablet Commonly known as: LOPRESSOR Take 1 tablet (100 mg total) by mouth 2 (two) times daily.   Multi-Vitamin tablet Take 1 tablet by mouth daily.   predniSONE 2.5 MG tablet Commonly known as: DELTASONE Take 1 tablet (2.5 mg total) by mouth 2 (two) times daily with a meal.            Discharge Care Instructions  (From admission, onward)         Start     Ordered   10/08/20 0000  Discharge wound care:       Comments: Refer to wc and follow by RN   10/08/20 1007          Contact information for follow-up providers    Idelle Crouch, MD Follow up in 1 week(s).   Specialty: Internal Medicine Contact information: [place] [address] [phone]             Contact information for after-discharge care    Destination    [facility] .   Service: Skilled Nursing Contact information: [address] [place] [id number]                  Signed: Sharen Hones 10/08/2020, 10:07 AM

## 2020-10-08 NOTE — TOC Transition Note (Signed)
Transition of Care [facility]) - CM/SW Discharge Note   Patient Details  Name: Laurie Jackson MRN: [id number] Date of Birth: [birth year]  Transition of [facility]) CM/SW Contact:  Hetty Ely, RN Phone Number: 10/08/2020, 10:17 AM   Final next level of care: Skilled Nursing Facility Barriers to Discharge: Barriers Resolved   Patient Goals and CMS Choice Patient states their goals for this hospitalization and ongoing recovery are:: To SNF PER FAMILY. CMS Medicare.gov Compare Post Acute Care list provided to:: Patient Represenative (must comment) (Husband and Daughter) Choice offered to / list presented to : Adult Children,Spouse  Discharge Placement                Patient to be transferred to facility by: [facility] Name of family member notified: Garrett Bowring- daughter Patient and family notified of of transfer: 10/08/20  Discharge Plan and Services                DME Arranged: N/A DME Agency: NA       HH Arranged: NA HH Agency: NA        Social Determinants of Health (SDOH) Interventions     Readmission Risk Interventions No flowsheet data found.

## 2020-10-15 DIAGNOSIS — G309 Alzheimer's disease, unspecified: Secondary | ICD-10-CM | POA: Diagnosis not present

## 2020-10-15 DIAGNOSIS — L8992 Pressure ulcer of unspecified site, stage 2: Secondary | ICD-10-CM | POA: Diagnosis not present

## 2020-10-15 DIAGNOSIS — E271 Primary adrenocortical insufficiency: Secondary | ICD-10-CM | POA: Diagnosis not present

## 2020-10-15 DIAGNOSIS — F0150 Vascular dementia without behavioral disturbance: Secondary | ICD-10-CM | POA: Diagnosis not present

## 2020-10-15 DIAGNOSIS — D638 Anemia in other chronic diseases classified elsewhere: Secondary | ICD-10-CM | POA: Diagnosis not present

## 2020-10-15 DIAGNOSIS — I480 Paroxysmal atrial fibrillation: Secondary | ICD-10-CM | POA: Diagnosis not present

## 2020-10-15 DIAGNOSIS — L89620 Pressure ulcer of left heel, unstageable: Secondary | ICD-10-CM | POA: Diagnosis not present

## 2020-10-15 DIAGNOSIS — L89610 Pressure ulcer of right heel, unstageable: Secondary | ICD-10-CM | POA: Diagnosis not present

## 2020-10-19 ENCOUNTER — Other Ambulatory Visit: Payer: Self-pay

## 2020-10-19 ENCOUNTER — Non-Acute Institutional Stay: Payer: Self-pay | Admitting: Primary Care

## 2020-10-20 DIAGNOSIS — F0280 Dementia in other diseases classified elsewhere without behavioral disturbance: Secondary | ICD-10-CM | POA: Diagnosis not present

## 2020-10-20 DIAGNOSIS — G309 Alzheimer's disease, unspecified: Secondary | ICD-10-CM | POA: Diagnosis not present

## 2020-10-20 DIAGNOSIS — F0150 Vascular dementia without behavioral disturbance: Secondary | ICD-10-CM | POA: Diagnosis not present

## 2020-10-21 ENCOUNTER — Non-Acute Institutional Stay: Payer: Self-pay | Admitting: Primary Care

## 2020-10-21 ENCOUNTER — Other Ambulatory Visit: Payer: Self-pay

## 2020-10-21 DIAGNOSIS — F0150 Vascular dementia without behavioral disturbance: Secondary | ICD-10-CM

## 2020-10-21 DIAGNOSIS — R532 Functional quadriplegia: Secondary | ICD-10-CM

## 2020-10-21 DIAGNOSIS — Z515 Encounter for palliative care: Secondary | ICD-10-CM

## 2020-10-21 DIAGNOSIS — I480 Paroxysmal atrial fibrillation: Secondary | ICD-10-CM | POA: Diagnosis not present

## 2020-10-21 DIAGNOSIS — M1711 Unilateral primary osteoarthritis, right knee: Secondary | ICD-10-CM

## 2020-10-21 DIAGNOSIS — G309 Alzheimer's disease, unspecified: Secondary | ICD-10-CM | POA: Diagnosis not present

## 2020-10-21 DIAGNOSIS — F0280 Dementia in other diseases classified elsewhere without behavioral disturbance: Secondary | ICD-10-CM | POA: Diagnosis not present

## 2020-10-21 NOTE — Progress Notes (Signed)
[facility] Palliative Care Consult Note Telephone: [phone]  Fax: [phone]   TELEHEALTH VISIT STATEMENT Due to the COVID-19 crisis, this visit was done via telemedicine from my office. It was initiated and consented to by this patient and/or family.   Date of encounter: 10/21/20 PATIENT NAME: Laurie Jackson [place] [id number] [phone] (home)  DOB: Jan 14, 1941 MRN: [id number]  PRIMARY CARE PROVIDER:    Idelle Crouch, MD,  [address] [place] [address] [phone]  REFERRING PROVIDER:   Gennie Jackson, [place],  [place] [address] [phone]  RESPONSIBLE PARTY:   Extended Emergency Contact Information Primary Emergency Contact: Laurie, Jackson Mobile Phone: [phone] Relation: Daughter Secondary Emergency Contact: Laurie,Jackson Address: [address] [place], [place] [address] Home Phone: [phone] Mobile Phone: [phone] Relation: Spouse  I met virtually with video with patient facility. Palliative Care was asked to follow this patient by consultation request of Laurie Alma, MD to help address advance care planning and goals of care. This is a new patient visit.   ASSESSMENT AND RECOMMENDATIONS:   1. Advance Care Planning/Goals of Care: Goals include to maximize quality of life and symptom management. Our advance care planning conversation included a discussion about:     The value and importance of advance care planning   Exploration of personal, cultural or spiritual beliefs that might influence medical decisions   Identification  of a healthcare agent - Daughter  Review  of an  advance directive document.  Pt was a DNR per her daughter during her most recent hospitalization   2. Symptom Management:  Debility   - Continue PT at rehab . Goal is for patient to return home to be cared for by her daughter and husband.  Husband is also elderly and requires some care, so goal for patient would be some ability for self care. We discussed the various community based agencies for support at home, as patient was somewhat functional at home prior to hospitalization. Daughter would like updates from [facility], PT and OT re gains made.    3. Follow up Palliative Care Visit: Palliative care will continue to follow for goals of care clarification and symptom management. Return 2 weeks or prn.  4. Family /Caregiver/Community Supports: Spoke with patient and daughter (2 calls). In SNF for rehab but expected to return to community, supports needed.  5. Cognitive / Functional decline: Patient has dementia and has had a decline over the last two months leading up to her hospitalization, PT recommended SNF at d/c  I spent 35 minutes providing this consultation,  from 1100 to 1135. More than 50% of the time in this consultation was spent in counseling and care coordination.  CODE STATUS: DNR  PPS: 30%  HOSPICE ELIGIBILITY/DIAGNOSIS: TBD  Subjective:  CHIEF COMPLAINT: Patient is a new consult for debility s/p hospital admission from 1/19-1/28  HISTORY OF PRESENT ILLNESS:  Laurie Jackson is a 80 y.o. year old female  with a PMH of Afib, HTN, and dementia. She was admitted to the hospital on 1/19 for severe sepsis 2/2 UTI. Patient had been declining over the last 2 months, and had gotten very weak in the weeks leading up to her admission. Pt was hypotensive and febrile on admission. UA showed an abundance of bacteria. Pt also had an AKI and was in afib with RVR.  Debility has been compounded by recent  events of UTI and deconditioning.  Today patient is seen on video sitting up in bed eating lunch. She denies any pain. She states that she has not started working with therapy yet, but she is hoping to in the next few weeks.  However, OT was assisting her with lunch.  Daughter Laurie Jackson would like to speak with therapy services RE  the plan for more rehab vs coming home with home care support.  We are asked to consult around advance care planning and complex medical decision making.    Review and summarization of old Epic records shows or history from other than patient Re recent hospital stay. Review or lab tests, radiology,  or medicine albumin low at 3.0, GFR 35 during hospital stay. Review of case with family member Laurie Jackson, daughter Decision for obtaining previous records  RE more recent GFR, labs from SNF records. Reviewed ECG of 10/05/18  History obtained from review of EMR, discussion with primary team, and  interview with family, caregiver  and/or Ms. Laurie Jackson. Records reviewed and summarized above.   CURRENT PROBLEM LIST:  Patient Active Problem List   Diagnosis Date Noted  . UTI (urinary tract infection) 09/29/2020  . Severe sepsis ([facility]) 09/28/2020  . AKI (acute kidney injury) ([facility]) 09/28/2020  . Sacral decubitus ulcer 09/28/2020  . Cellulitis 09/28/2020  . Atrial fibrillation, chronic ([facility]) 09/28/2020  . AF (paroxysmal atrial fibrillation) ([facility]) 09/28/2020  . Chronic anticoagulation 09/28/2020  . Functional quadriplegia ([facility]) 09/28/2020  . History of lacunar cerebrovascular accident 09/28/2020  . Lacunar infarction ([facility]) 12/03/2019  . Mixed Alzheimer's and vascular dementia ([facility]) 12/03/2019  . Chronic anemia 12/19/2018  . Hand pain 05/29/2016  . Primary osteoarthritis of right knee 12/29/2014  . Benign essential hypertension 02/13/2014  . Pure hypercholesterolemia 02/13/2014   PAST MEDICAL HISTORY:  Active Ambulatory Problems    Diagnosis Date Noted  . Benign essential hypertension 02/13/2014  . Chronic anemia 12/19/2018  . Hand pain 05/29/2016  . Lacunar infarction ([facility]) 12/03/2019  . Mixed Alzheimer's and vascular dementia ([facility]) 12/03/2019  . Primary osteoarthritis of right knee 12/29/2014  . Pure hypercholesterolemia 02/13/2014  . Severe sepsis ([facility]) 09/28/2020  . AKI (acute kidney  injury) ([facility]) 09/28/2020  . Sacral decubitus ulcer 09/28/2020  . Cellulitis 09/28/2020  . Atrial fibrillation, chronic ([facility]) 09/28/2020  . AF (paroxysmal atrial fibrillation) ([facility]) 09/28/2020  . Chronic anticoagulation 09/28/2020  . Functional quadriplegia ([facility]) 09/28/2020  . History of lacunar cerebrovascular accident 09/28/2020  . UTI (urinary tract infection) 09/29/2020   Resolved Ambulatory Problems    Diagnosis Date Noted  . No Resolved Ambulatory Problems   Past Medical History:  Diagnosis Date  . Dementia ([facility])   . HTN (hypertension)   . OA (osteoarthritis)   . Paroxysmal A-fib ([facility])    SOCIAL HX:  Social History   Tobacco Use  . Smoking status: Never Smoker  . Smokeless tobacco: Never Used  Substance Use Topics  . Alcohol use: Never   FAMILY HX:  Family History  Problem Relation Age of Onset  . Breast cancer Neg Hx       ALLERGIES:  Allergies  Allergen Reactions  . Penicillin G Other (See Comments)    Drunk feeling/weak     PERTINENT MEDICATIONS:  Outpatient Encounter Medications as of 10/21/2020  Medication Sig  . apixaban (ELIQUIS) 2.5 MG TABS tablet TAKE 1 TABLET (2.5 MG TOTAL) BY MOUTH EVERY 12 (TWELVE) HOURS  . diclofenac Sodium (VOLTAREN) 1 % GEL Apply 2 g topically 4 (  four) times daily.  Marland Kitchen donepezil (ARICEPT) 10 MG tablet Take 10 mg by mouth daily.  . feeding supplement (ENSURE ENLIVE / ENSURE PLUS) LIQD Take 237 mLs by mouth 2 (two) times daily between meals.  . ferrous sulfate 325 (65 FE) MG tablet Take 325 mg by mouth 2 (two) times daily.  . megestrol (MEGACE) 40 MG/ML suspension Take 10 mLs by mouth daily.  . metoprolol tartrate (LOPRESSOR) 100 MG tablet Take 1 tablet (100 mg total) by mouth 2 (two) times daily.  . Multiple Vitamin (MULTI-VITAMIN) tablet Take 1 tablet by mouth daily.  . predniSONE (DELTASONE) 2.5 MG tablet Take 1 tablet (2.5 mg total) by mouth 2 (two) times daily with a meal.   No facility-administered encounter medications on  file as of 10/21/2020.    Objective: ROS/self and staff  General: NAD EYES: denies vision changes, wears glasses ENMT: denies dysphagia Cardiovascular: denies chest pain Pulmonary: denies  cough, denies increased SOB Abdomen: endorses fair appetite, endorses constipation, endorses continence of bowel GU: denies dysuria, endorses continence of urine MSK:  endorses ROM limitations, no falls reported Skin: denies rashes or wounds Neurological: endorses weakness, denies pain, denies insomnia Psych: Endorses positive mood Heme/lymph/immuno: denies bruises, abnormal bleeding  Physical Exam: Current and past weights: 129 lbs per epic record Constitutional:  NAD General: frail appearing, thin EYES: anicteric sclera, lids intact, no discharge  Pulmonary:  no increased work of breathing, no cough, no audible wheezes, room air Abdomen: intake 50%, no ascites GU: deferred MSK: mod sarcopenia, decreased ROM in all extremities Skin: warm and dry, no rashes or wounds on visible skin Neuro: Generalized weakness, + cognitive impairment Psych: non-anxious affect, A and O x 1-2 Hem/lymph/immuno: no widespread bruising   Thank you for the opportunity to participate in the care of Ms. Laurie Jackson.  The palliative care team will continue to follow. Please call our office at [phone] if we can be of additional assistance.  Jason Coop, NP , DNP, MPH, AGPCNP-BC, [facility]

## 2020-10-27 ENCOUNTER — Ambulatory Visit: Payer: Medicare HMO | Admitting: Internal Medicine

## 2020-11-03 ENCOUNTER — Other Ambulatory Visit: Payer: Self-pay

## 2020-11-03 ENCOUNTER — Non-Acute Institutional Stay: Payer: Self-pay | Admitting: Primary Care

## 2020-11-03 DIAGNOSIS — F0150 Vascular dementia without behavioral disturbance: Secondary | ICD-10-CM | POA: Diagnosis not present

## 2020-11-03 DIAGNOSIS — Z515 Encounter for palliative care: Secondary | ICD-10-CM

## 2020-11-03 DIAGNOSIS — M1711 Unilateral primary osteoarthritis, right knee: Secondary | ICD-10-CM

## 2020-11-03 DIAGNOSIS — R532 Functional quadriplegia: Secondary | ICD-10-CM

## 2020-11-03 DIAGNOSIS — G309 Alzheimer's disease, unspecified: Secondary | ICD-10-CM | POA: Diagnosis not present

## 2020-11-03 DIAGNOSIS — I480 Paroxysmal atrial fibrillation: Secondary | ICD-10-CM | POA: Diagnosis not present

## 2020-11-03 DIAGNOSIS — F0280 Dementia in other diseases classified elsewhere without behavioral disturbance: Secondary | ICD-10-CM | POA: Diagnosis not present

## 2020-11-03 NOTE — Progress Notes (Signed)
[facility] Palliative Care Consult Note Telephone: [phone]  Fax: [phone]    Date of encounter: 11/03/20 PATIENT NAME: Laurie Jackson [place] [address] [phone] (home)  DOB: [birth year] MRN: [id number]  PRIMARY CARE PROVIDER:    Gennie Alma, MD,  [place] [address] [place]:   Gennie Alma, [address] [place],  [place] [address] [phone]  RESPONSIBLE PARTY:   Extended Emergency Contact Information Primary Emergency Contact: Vineta, Carone Mobile Phone: [phone] Relation: Daughter Secondary Emergency Contact: Wedge,[name] Address: [address] [place], [place] [address] Home Phone: [phone] Mobile Phone: [phone] Relation: Spouse  I met face to face with patient and family in the facility. Palliative Care was asked to follow this patient by consultation request of Gennie Alma, MD to help address advance care planning and goals of care. This is a follow up  visit.   ASSESSMENT AND RECOMMENDATIONS:   1. Advance Care Planning/Goals of Care: Goals include to maximize quality of life and symptom management. Our advance care planning conversation included a discussion about:     Experiences with loved ones who have been seriously ill or have died   Exploration of personal, cultural or spiritual beliefs that might influence medical decisions   DNR On record  2. Symptom Management:   Debility; Appears alert and interactive but an unreliable historian. Daughter at bedside, here to feed dinner. She had hoped for more recovery but appears now patient's dementia is advanced. She is not able to do adls without cueing and in most tasks, is dependent. Discussed disposition, will likely remain in place due to increasing debility. Daughter would like OT consult to provide weighted spoon for pt to self feed.  Nutrition;  Fair  intake, has wounds on buttock Needs increased calories, supplements eg med pass. Needs  For wound healing. Also recommend feeding each meal, or cueing her to eat. Albumin trending down a month ago, need updated albumin.   Wound management: Wounds on buttock, daughter observes pt likes to stay on buttock and does not reposition herself. Needs alt pressure mattress or similar for bed. Wound managed by treatment nurse.   3. Follow up Palliative Care Visit: Palliative care will continue to follow for goals of care clarification and symptom management. Return 2 weeks or prn.  4. Family /Caregiver/Community Supports: daughter moved to area from [place] to care for parents. Pt is in LTC/ SNF currently.  5. Cognitive / Functional decline: A and O x 1, able to answer some questions, dependent in all adls and iadls.  I spent 35 minutes providing this consultation,  from 1700 to 1735. More than 50% of the time in this consultation was spent in counseling and care coordination.  CODE STATUS: DNR  PPS: 30%  HOSPICE ELIGIBILITY/DIAGNOSIS: TBD  Subjective:  CHIEF COMPLAINT: debility  HISTORY OF PRESENT ILLNESS:  HISTORY OF PRESENT ILLNESS:  Laurie Jackson is a 80 y.o. year old female  with a PMH of Afib, HTN, and dementia. She was admitted to the hospital on 1/19 for severe sepsis 2/2 UTI. Patient had been declining over the last 2 months, and had gotten very weak in the weeks leading up to her admission. Pt was hypotensive and febrile on admission. UA showed an abundance of bacteria. Pt also had an AKI and was in afib with RVR.  Debility has been compounded by recent events of UTI  and deconditioning. Patient has little ability for self adls, and is dependent for all adls or to be cued.  We are asked to consult around advance care planning and complex medical decision making.    Review and summarization of old Epic records shows or history from other than patient.  Review or lab tests, radiology,  or  medicine. Albumin 3.0, ct after fall in 12/21. Review of case with family member. Chanda, at bedside.  History obtained from review of EMR, discussion with primary team, and  interview with family, caregiver  and/or Ms. Fales. Records reviewed and summarized above.   CURRENT PROBLEM LIST:  Patient Active Problem List   Diagnosis Date Noted  . UTI (urinary tract infection) 09/29/2020  . Severe sepsis (HCC) 09/28/2020  . AKI (acute kidney injury) (HCC) 09/28/2020  . Sacral decubitus ulcer 09/28/2020  . Cellulitis 09/28/2020  . Atrial fibrillation, chronic (HCC) 09/28/2020  . AF (paroxysmal atrial fibrillation) (HCC) 09/28/2020  . Chronic anticoagulation 09/28/2020  . Functional quadriplegia (HCC) 09/28/2020  . History of lacunar cerebrovascular accident 09/28/2020  . Lacunar infarction (HCC) 12/03/2019  . Mixed Alzheimer's and vascular dementia (HCC) 12/03/2019  . Chronic anemia 12/19/2018  . Hand pain 05/29/2016  . Primary osteoarthritis of right knee 12/29/2014  . Benign essential hypertension 02/13/2014  . Pure hypercholesterolemia 02/13/2014   PAST MEDICAL HISTORY:  Active Ambulatory Problems    Diagnosis Date Noted  . Benign essential hypertension 02/13/2014  . Chronic anemia 12/19/2018  . Hand pain 05/29/2016  . Lacunar infarction (HCC) 12/03/2019  . Mixed Alzheimer's and vascular dementia (HCC) 12/03/2019  . Primary osteoarthritis of right knee 12/29/2014  . Pure hypercholesterolemia 02/13/2014  . Severe sepsis (HCC) 09/28/2020  . AKI (acute kidney injury) (HCC) 09/28/2020  . Sacral decubitus ulcer 09/28/2020  . Cellulitis 09/28/2020  . Atrial fibrillation, chronic (HCC) 09/28/2020  . AF (paroxysmal atrial fibrillation) (HCC) 09/28/2020  . Chronic anticoagulation 09/28/2020  . Functional quadriplegia (HCC) 09/28/2020  . History of lacunar cerebrovascular accident 09/28/2020  . UTI (urinary tract infection) 09/29/2020   Resolved Ambulatory Problems    Diagnosis  Date Noted  . No Resolved Ambulatory Problems   Past Medical History:  Diagnosis Date  . Dementia (HCC)   . HTN (hypertension)   . OA (osteoarthritis)   . Paroxysmal A-fib (HCC)    SOCIAL HX:  Social History   Tobacco Use  . Smoking status: Never Smoker  . Smokeless tobacco: Never Used  Substance Use Topics  . Alcohol use: Never   FAMILY HX:  Family History  Problem Relation Age of Onset  . Breast cancer Neg Hx       ALLERGIES:  Allergies  Allergen Reactions  . Penicillin G Other (See Comments)    Drunk feeling/weak     PERTINENT MEDICATIONS:  Outpatient Encounter Medications as of 11/03/2020  Medication Sig  . apixaban (ELIQUIS) 2.5 MG TABS tablet TAKE 1 TABLET (2.5 MG TOTAL) BY MOUTH EVERY 12 (TWELVE) HOURS  . diclofenac Sodium (VOLTAREN) 1 % GEL Apply 2 g topically 4 (four) times daily.  . donepezil (ARICEPT) 10 MG tablet Take 10 mg by mouth daily.  . feeding supplement (ENSURE ENLIVE / ENSURE PLUS) LIQD Take 237 mLs by mouth 2 (two) times daily between meals.  . ferrous sulfate 325 (65 FE) MG tablet Take 325 mg by mouth 2 (two) times daily.  . megestrol (MEGACE) 40 MG/ML suspension Take 10 mLs by mouth daily.  . metoprolol tartrate (LOPRESSOR) 100 MG tablet Take 1   tablet (100 mg total) by mouth 2 (two) times daily.  . Multiple Vitamin (MULTI-VITAMIN) tablet Take 1 tablet by mouth daily.  . predniSONE (DELTASONE) 2.5 MG tablet Take 1 tablet (2.5 mg total) by mouth 2 (two) times daily with a meal.   No facility-administered encounter medications on file as of 11/03/2020.    Objective: ROS  General: NAD ENMT: denies dysphagia Cardiovascular: denies chest pain Pulmonary: denies  cough, denies increased SOB Abdomen: endorses fair appetite, denies constipation, endorses incontinence of bowel GU: denies dysuria, endorses incontinence of urine MSK:  endorses ROM limitations, no falls reported Skin: denies rashes or wounds Neurological: endorses weakness, denies  pain, denies insomnia Psych: Endorses positive mood Heme/lymph/immuno: denies bruises, abnormal bleeding  Physical Exam: Current and past weights: 131 lbs in 1/22; 122 lbs today 11/03/20. Constitutional: NAD General: frail appearing, thin EYES: anicteric sclera, lids intact, no discharge  ENMT: intact hearing,oral mucous membranes moist CV:  no LE edema Pulmonary:, no increased work of breathing, no cough, no audible wheezes, room air Abdomen: intake 75%, no ascites GU: deferred MSK: mod sarcopenia, decreased ROM in all extremities, no contractures of LE, non ambulatory, malformations of toes, in bunny boots, feet sore Skin: warm and dry, no rashes or wounds on visible skin Neuro: Generalized weakness, advanced cognitive impairment Psych: non anxious affect, A and O x 1 Hem/lymph/immuno: no widespread bruising   Thank you for the opportunity to participate in the care of [name].  The palliative care team will continue to follow. Please call our office at [phone] if we can be of additional assistance.  Jason Coop, NP , DNP, MPH, AGPCNP-BC, ACHPN   COVID-19 PATIENT SCREENING TOOL  Person answering questions: _______staff____________   1.  Is the patient or any family member in the home showing any signs or symptoms regarding respiratory infection?                  Person with Symptom  ______________na___________ a. Fever/chills/headache                                                        Yes___ No__X_            b. Shortness of breath                                                            Yes___ No__X_           c. Cough/congestion                                               Yes___  No__X_          d. Muscle/Body aches/pains                                                   Yes___ No__X_  e. Gastrointestinal symptoms (diarrhea,nausea)             Yes___ No__X_         f. Sudden loss of smell or taste      Yes___ No__X_        2. Within the  past 10 days, has anyone living in the home had any contact with someone with or under investigation for COVID-19?    Yes___ No__X__   Person __________________     [facility] Palliative [facility] Consult Note Telephone: [phone]  Fax: [phone]   TELEHEALTH VISIT STATEMENT Due to the COVID-19 crisis, this visit was done via telemedicine from my office. It was initiated and consented to by this patient and/or family.   Date of encounter: 10/21/20 PATIENT NAME: Rossanna Depascale [address] [place] [address] [phone] (home)  DOB: 01/15/1941 MRN: [id number]  PRIMARY CARE PROVIDER:    Sparks, Jeffrey D, MD,  [address] [facility] [place] [address] [phone]  REFERRING PROVIDER:   Ladak, Shenif, MD [address] [place],  [place] [address] [phone]  RESPONSIBLE PARTY:   Extended Emergency Contact Information Primary Emergency Contact: Banning, Chanda Mobile Phone: [phone] Relation: Daughter Secondary Emergency Contact: Meschke,[name] Address: [address]          [place], [place] [address] Home Phone: [phone] Mobile Phone: [phone] Relation: Spouse  I met virtually with video with patient facility. Palliative Care was asked to follow this patient by consultation request of Ladak, Shenif, MD to help address advance care planning and goals of care. This is a new patient visit.   ASSESSMENT AND RECOMMENDATIONS:   1. Advance Care Planning/Goals of Care: Goals include to maximize quality of life and symptom management. Our advance care planning conversation included a discussion about:     The value and importance of advance care planning   Exploration of personal, cultural or spiritual beliefs that might influence medical decisions   Identification  of a healthcare agent - Daughter  Review  of an  advance directive document.  Pt was a DNR per her daughter during her most recent  hospitalization   2. Symptom Management:  Debility   - Continue PT at rehab . Goal is for patient to return home to be cared for by her daughter and husband. Husband is also elderly and requires some care, so goal for patient would be some ability for self care. We discussed the various community based agencies for support at home, as patient was somewhat functional at home prior to hospitalization. Daughter would like updates from ST, PT and OT re gains made.    3. Follow up Palliative Care Visit: Palliative care will continue to follow for goals of care clarification and symptom management. Return 2 weeks or prn.  4. Family /Caregiver/Community Supports: Spoke with patient and daughter (2 calls). In SNF for rehab but expected to return to community, supports needed.  5. Cognitive / Functional decline: Patient has dementia and has had a decline over the last two months leading up to her hospitalization, PT recommended SNF at d/c  I spent 35 minutes providing this consultation,  from 1100 to 1135. More than 50% of the time in this consultation was spent in counseling and care coordination.  CODE STATUS: DNR  PPS: 30%  HOSPICE ELIGIBILITY/DIAGNOSIS: TBD  Subjective:  CHIEF COMPLAINT: Patient is a new consult for debility s/p hospital admission from 1/19-1/28  HISTORY OF PRESENT ILLNESS:  Dalana Pollett   is a 80 y.o. year old female  with a PMH of Afib, HTN, and dementia. She was admitted to the hospital on 1/19 for severe sepsis 2/2 UTI. Patient had been declining over the last 2 months, and had gotten very weak in the weeks leading up to her admission. Pt was hypotensive and febrile on admission. UA showed an abundance of bacteria. Pt also had an AKI and was in afib with RVR.  Debility has been compounded by recent events of UTI and deconditioning.  Today patient is seen on video sitting up in bed eating lunch. She denies any pain. She states that she has not started working with therapy  yet, but she is hoping to in the next few weeks.  However, OT was assisting her with lunch.  Daughter Tamilyn Lupien would like to speak with therapy services RE the plan for more rehab vs coming home with home care support.  We are asked to consult around advance care planning and complex medical decision making.    Review and summarization of old Epic records shows or history from other than patient Re recent hospital stay. Review or lab tests, radiology,  or medicine albumin low at 3.0, GFR 35 during hospital stay. Review of case with family member [name], daughter Decision for obtaining previous records  RE more recent GFR, labs from SNF records. Reviewed ECG of 10/05/18  History obtained from review of EMR, discussion with primary team, and  interview with family, caregiver  and/or [name]. Records reviewed and summarized above.   CURRENT PROBLEM LIST:  Patient Active Problem List   Diagnosis Date Noted  . UTI (urinary tract infection) 09/29/2020  . Severe sepsis ([place]) 09/28/2020  . AKI (acute kidney injury) ([place]) 09/28/2020  . Sacral decubitus ulcer 09/28/2020  . Cellulitis 09/28/2020  . Atrial fibrillation, chronic ([place]) 09/28/2020  . AF (paroxysmal atrial fibrillation) ([place]) 09/28/2020  . Chronic anticoagulation 09/28/2020  . Functional quadriplegia ([place]) 09/28/2020  . History of lacunar cerebrovascular accident 09/28/2020  . Lacunar infarction ([place]) 12/03/2019  . Mixed Alzheimer's and vascular dementia ([place]) 12/03/2019  . Chronic anemia 12/19/2018  . Hand pain 05/29/2016  . Primary osteoarthritis of right knee 12/29/2014  . Benign essential hypertension 02/13/2014  . Pure hypercholesterolemia 02/13/2014   PAST MEDICAL HISTORY:  Active Ambulatory Problems    Diagnosis Date Noted  . Benign essential hypertension 02/13/2014  . Chronic anemia 12/19/2018  . Hand pain 05/29/2016  . Lacunar infarction ([place]) 12/03/2019  . Mixed Alzheimer's and vascular dementia ([place])  12/03/2019  . Primary osteoarthritis of right knee 12/29/2014  . Pure hypercholesterolemia 02/13/2014  . Severe sepsis ([place]) 09/28/2020  . AKI (acute kidney injury) ([place]) 09/28/2020  . Sacral decubitus ulcer 09/28/2020  . Cellulitis 09/28/2020  . Atrial fibrillation, chronic ([place] [facility]) 09/28/2020  . AF (paroxysmal atrial fibrillation) ([place]) 09/28/2020  . Chronic anticoagulation 09/28/2020  . Functional quadriplegia ([place]) 09/28/2020  . History of lacunar cerebrovascular accident 09/28/2020  . UTI (urinary tract infection) 09/29/2020   Resolved Ambulatory Problems    Diagnosis Date Noted  . No Resolved Ambulatory Problems   Past Medical History:  Diagnosis Date  . Dementia ([facility])   . HTN (hypertension)   . OA (osteoarthritis)   . Paroxysmal A-fib (HCC)    SOCIAL HX:  Social History   Tobacco Use  . Smoking status: Never Smoker  . Smokeless tobacco: Never Used  Substance Use Topics  . Alcohol use: Never   FAMILY HX:  Family History  Problem Relation  Age of Onset  . Breast cancer Neg Hx       ALLERGIES:  Allergies  Allergen Reactions  . Penicillin G Other (See Comments)    Drunk feeling/weak     PERTINENT MEDICATIONS:  Outpatient Encounter Medications as of 10/21/2020  Medication Sig  . apixaban (ELIQUIS) 2.5 MG TABS tablet TAKE 1 TABLET (2.5 MG TOTAL) BY MOUTH EVERY 12 (TWELVE) HOURS  . diclofenac Sodium (VOLTAREN) 1 % GEL Apply 2 g topically 4 (four) times daily.  . donepezil (ARICEPT) 10 MG tablet Take 10 mg by mouth daily.  . feeding supplement (ENSURE ENLIVE / ENSURE PLUS) LIQD Take 237 mLs by mouth 2 (two) times daily between meals.  . ferrous sulfate 325 (65 FE) MG tablet Take 325 mg by mouth 2 (two) times daily.  . megestrol (MEGACE) 40 MG/ML suspension Take 10 mLs by mouth daily.  . metoprolol tartrate (LOPRESSOR) 100 MG tablet Take 1 tablet (100 mg total) by mouth 2 (two) times daily.  . Multiple Vitamin (MULTI-VITAMIN) tablet Take 1 tablet by mouth daily.  .  predniSONE (DELTASONE) 2.5 MG tablet Take 1 tablet (2.5 mg total) by mouth 2 (two) times daily with a meal.   No facility-administered encounter medications on file as of 10/21/2020.    Objective: ROS/self and staff  General: NAD EYES: denies vision changes, wears glasses ENMT: denies dysphagia Cardiovascular: denies chest pain Pulmonary: denies  cough, denies increased SOB Abdomen: endorses fair appetite, endorses constipation, endorses continence of bowel GU: denies dysuria, endorses continence of urine MSK:  endorses ROM limitations, no falls reported Skin: denies rashes or wounds Neurological: endorses weakness, denies pain, denies insomnia Psych: Endorses positive mood Heme/lymph/immuno: denies bruises, abnormal bleeding  Physical Exam: Current and past weights: 129 lbs per epic record Constitutional:  NAD General: frail appearing, thin EYES: anicteric sclera, lids intact, no discharge  Pulmonary:  no increased work of breathing, no cough, no audible wheezes, room air Abdomen: intake 50%, no ascites GU: deferred MSK: mod sarcopenia, decreased ROM in all extremities Skin: warm and dry, no rashes or wounds on visible skin Neuro: Generalized weakness, + cognitive impairment Psych: non-anxious affect, A and O x 1-2 Hem/lymph/immuno: no widespread bruising   Thank you for the opportunity to participate in the care of Ms. Lybbert.  The palliative care team will continue to follow. Please call our office at [phone] if we can be of additional assistance.  Kathryn McKelvey Smith, NP , DNP, MPH, AGPCNP-BC, ACHPN    

## 2020-11-05 ENCOUNTER — Non-Acute Institutional Stay: Payer: Self-pay | Admitting: Primary Care

## 2020-11-09 DIAGNOSIS — D638 Anemia in other chronic diseases classified elsewhere: Secondary | ICD-10-CM | POA: Diagnosis not present

## 2020-11-09 DIAGNOSIS — E271 Primary adrenocortical insufficiency: Secondary | ICD-10-CM | POA: Diagnosis not present

## 2020-11-09 DIAGNOSIS — I480 Paroxysmal atrial fibrillation: Secondary | ICD-10-CM | POA: Diagnosis not present

## 2020-11-09 DIAGNOSIS — F0150 Vascular dementia without behavioral disturbance: Secondary | ICD-10-CM | POA: Diagnosis not present

## 2020-11-12 DIAGNOSIS — E039 Hypothyroidism, unspecified: Secondary | ICD-10-CM | POA: Diagnosis not present

## 2020-11-12 DIAGNOSIS — E559 Vitamin D deficiency, unspecified: Secondary | ICD-10-CM | POA: Diagnosis not present

## 2020-11-12 DIAGNOSIS — I10 Essential (primary) hypertension: Secondary | ICD-10-CM | POA: Diagnosis not present

## 2020-11-12 DIAGNOSIS — D509 Iron deficiency anemia, unspecified: Secondary | ICD-10-CM | POA: Diagnosis not present

## 2020-11-12 DIAGNOSIS — R739 Hyperglycemia, unspecified: Secondary | ICD-10-CM | POA: Diagnosis not present

## 2020-11-12 DIAGNOSIS — D519 Vitamin B12 deficiency anemia, unspecified: Secondary | ICD-10-CM | POA: Diagnosis not present

## 2020-11-15 ENCOUNTER — Non-Acute Institutional Stay: Payer: Self-pay | Admitting: Primary Care

## 2020-11-15 ENCOUNTER — Other Ambulatory Visit: Payer: Self-pay

## 2020-11-15 DIAGNOSIS — Z515 Encounter for palliative care: Secondary | ICD-10-CM

## 2020-11-15 DIAGNOSIS — F0150 Vascular dementia without behavioral disturbance: Secondary | ICD-10-CM | POA: Diagnosis not present

## 2020-11-15 DIAGNOSIS — R532 Functional quadriplegia: Secondary | ICD-10-CM

## 2020-11-15 DIAGNOSIS — G309 Alzheimer's disease, unspecified: Secondary | ICD-10-CM | POA: Diagnosis not present

## 2020-11-15 DIAGNOSIS — F0280 Dementia in other diseases classified elsewhere without behavioral disturbance: Secondary | ICD-10-CM | POA: Diagnosis not present

## 2020-11-15 NOTE — Progress Notes (Signed)
Therapist, nutritional Palliative Care Consult Note Telephone: [phone]  Fax: [phone]   TELEHEALTH VISIT STATEMENT Due to the COVID-19 crisis, this visit was done via telemedicine from my office. It was initiated and consented to by this patient and/or family.   Date of encounter: 11/15/20 PATIENT NAME: Laurie Jackson [address] [place] [address] [phone] (home)  DOB: 10/06/1940 MRN: [id number]  PRIMARY CARE PROVIDER:    Clovis Cao, MD,  [address] [place] [address] [phone]  REFERRING PROVIDER:   Clovis Cao, MD [address] [place],  [place] [address] [phone]  RESPONSIBLE PARTY:   Extended Emergency Contact Information Primary Emergency Contact: Swan, [place] Mobile Phone: [phone] Relation: Daughter Secondary Emergency Contact: Belote,[name] Address: [address]          [place], [place] [address] Home Phone: [phone] Mobile Phone: [phone] Relation: Spouse   Palliative Care was asked to follow this patient by consultation request of Clovis Cao, MD to help address advance care planning and goals of care. This is a follow up   visit.   ASSESSMENT AND RECOMMENDATIONS:   1. Advance Care Planning/Goals of Care: Goals include to maximize quality of life and symptom management.  Discussed d/c from SNF upcoming. Daughter asked about hospice services. Report by SNF staff that patient is eating well and her wounds have healed or are healing. We discussed Home health services vs hospice as patient may not qualify for < 6 month prognosis. We discussed PCP being able to order home health and DME as needed. Discussed PC services at home. Daughter is concerned about the extent of care giving that they will face with new deficits.   We discussed caregiver burdens and possible avenues for supplementing at home. Will continue to discuss in context of complex medical management and advance  planning.   2.  Follow up Palliative Care Visit: Palliative care will continue to follow for goals of care clarification and symptom management. Return 4 weeks or prn.   I spent 25 minutes providing this consultation,  from 1600 to 1625. More than 50% of the time in this consultation was spent in counseling and care coordination.  CODE STATUS: Full  PPS: 30%  HOSPICE ELIGIBILITY/DIAGNOSIS: TBD  Subjective:  CHIEF COMPLAINT: debility  HISTORY OF PRESENT ILLNESS:  Laurie Jackson is a 80 y.o. year old female  with debility, care giver needs. .   We are asked to consult around advance care planning and complex medical decision making.    Review and summarization of old Epic records shows or history from other than patient.  Review of case with family member. Chanda.  History obtained from review of EMR, discussion with primary team, and  interview with family, caregiver  and/or Ms. Janee Morn. Records reviewed and summarized above.   CURRENT PROBLEM LIST:  Patient Active Problem List   Diagnosis Date Noted  . UTI (urinary tract infection) 09/29/2020  . Severe sepsis (HCC) 09/28/2020  . AKI (acute kidney injury) (HCC) 09/28/2020  . Sacral decubitus ulcer 09/28/2020  . Cellulitis 09/28/2020  . Atrial fibrillation, chronic (HCC) 09/28/2020  . AF (paroxysmal atrial fibrillation) (HCC) 09/28/2020  . Chronic anticoagulation 09/28/2020  . Functional quadriplegia (HCC) 09/28/2020  . History of lacunar cerebrovascular accident 09/28/2020  . Lacunar infarction (HCC) 12/03/2019  . Mixed Alzheimer's and vascular dementia (HCC) 12/03/2019  . Chronic anemia 12/19/2018  . Hand pain 05/29/2016  . Primary osteoarthritis of right knee 12/29/2014  . Benign essential hypertension  02/13/2014  . Pure hypercholesterolemia 02/13/2014   PAST MEDICAL HISTORY:  Active Ambulatory Problems    Diagnosis Date Noted  . Benign essential hypertension 02/13/2014  . Chronic anemia 12/19/2018  . Hand  pain 05/29/2016  . Lacunar infarction (HCC) 12/03/2019  . Mixed Alzheimer's and vascular dementia (HCC) 12/03/2019  . Primary osteoarthritis of right knee 12/29/2014  . Pure hypercholesterolemia 02/13/2014  . Severe sepsis (HCC) 09/28/2020  . AKI (acute kidney injury) (HCC) 09/28/2020  . Sacral decubitus ulcer 09/28/2020  . Cellulitis 09/28/2020  . Atrial fibrillation, chronic (HCC) 09/28/2020  . AF (paroxysmal atrial fibrillation) (HCC) 09/28/2020  . Chronic anticoagulation 09/28/2020  . Functional quadriplegia (HCC) 09/28/2020  . History of lacunar cerebrovascular accident 09/28/2020  . UTI (urinary tract infection) 09/29/2020   Resolved Ambulatory Problems    Diagnosis Date Noted  . No Resolved Ambulatory Problems   Past Medical History:  Diagnosis Date  . Dementia (HCC)   . HTN (hypertension)   . OA (osteoarthritis)   . Paroxysmal A-fib (HCC)    SOCIAL HX:  Social History   Tobacco Use  . Smoking status: Never Smoker  . Smokeless tobacco: Never Used  Substance Use Topics  . Alcohol use: Never   FAMILY HX:  Family History  Problem Relation Age of Onset  . Breast cancer Neg Hx       ALLERGIES:  Allergies  Allergen Reactions  . Penicillin G Other (See Comments)    Drunk feeling/weak     PERTINENT MEDICATIONS:  Outpatient Encounter Medications as of 11/15/2020  Medication Sig  . apixaban (ELIQUIS) 2.5 MG TABS tablet TAKE 1 TABLET (2.5 MG TOTAL) BY MOUTH EVERY 12 (TWELVE) HOURS  . diclofenac Sodium (VOLTAREN) 1 % GEL Apply 2 g topically 4 (four) times daily.  Marland Kitchen donepezil (ARICEPT) 10 MG tablet Take 10 mg by mouth daily.  . feeding supplement (ENSURE ENLIVE / ENSURE PLUS) LIQD Take 237 mLs by mouth 2 (two) times daily between meals.  . ferrous sulfate 325 (65 FE) MG tablet Take 325 mg by mouth 2 (two) times daily.  . megestrol (MEGACE) 40 MG/ML suspension Take 10 mLs by mouth daily.  . metoprolol tartrate (LOPRESSOR) 100 MG tablet Take 1 tablet (100 mg total) by  mouth 2 (two) times daily.  . Multiple Vitamin (MULTI-VITAMIN) tablet Take 1 tablet by mouth daily.  . predniSONE (DELTASONE) 2.5 MG tablet Take 1 tablet (2.5 mg total) by mouth 2 (two) times daily with a meal.   No facility-administered encounter medications on file as of 11/15/2020.    Objective: Deferred  Thank you for the opportunity to participate in the care of Ms. Janee Morn.  The palliative care team will continue to follow. Please call our office at [phone] if we can be of additional assistance.  Eliezer Lofts, NP , DNP, MPH, AGPCNP-BC, [facility]

## 2020-11-17 DIAGNOSIS — F0150 Vascular dementia without behavioral disturbance: Secondary | ICD-10-CM | POA: Diagnosis not present

## 2020-11-17 DIAGNOSIS — G309 Alzheimer's disease, unspecified: Secondary | ICD-10-CM | POA: Diagnosis not present

## 2020-11-17 DIAGNOSIS — F0280 Dementia in other diseases classified elsewhere without behavioral disturbance: Secondary | ICD-10-CM | POA: Diagnosis not present

## 2020-11-22 ENCOUNTER — Other Ambulatory Visit: Payer: Self-pay

## 2020-11-22 NOTE — Patient Outreach (Signed)
[facility]) Care Management  11/22/2020  Laurie Jackson 1941-03-15 [id number]     Transition of Care Referral  Referral Date: 11/22/2020  Referral Source: [facility] Discharge Report Date of Discharge: 11/19/2020 Facility: [facility] Insurance: [facility]   Referral received. Transition of care calls being completed via EMMI-automated calls.     Plan: RN CM will close case at this time.    [name], RN,BSN,CCM [facility] Care Management Telephonic Care Management Coordinator Direct Phone: [phone] Toll Free: [phone] Fax: [phone]

## 2020-11-23 ENCOUNTER — Other Ambulatory Visit: Payer: Self-pay

## 2020-11-23 NOTE — Patient Outreach (Signed)
[facility]) Care Management  11/23/2020  Nyara Capell 09-09-1941 [id number]     Transition of Care Referral  Referral Date: 11/22/2020 Referral Source: [facility] Discharge Report Date of Discharge: 11/19/2020 Facility: [facility] Insurance: [facility]    Outreach attempt # 1 to patient. Recording stating not accepting call at this time. RN CM attempted alternate number and number currently not in service.     Plan: RN CM will make outreach attempt to patient within 3-4 business days. RN CM will send unsuccessful outreach letter to patient.   [name], RN,BSN,CCM [facility] Care Management Telephonic Care Management Coordinator Direct Phone: [phone] Toll Free: [phone] Fax: [phone]

## 2020-11-24 ENCOUNTER — Other Ambulatory Visit: Payer: Self-pay

## 2020-11-24 NOTE — Patient Outreach (Signed)
[facility]) Care Management  11/24/2020  Laurie Jackson 1940-12-31 [id number]   Transition of Care Referral  Referral Date: 11/22/2020 Referral Source: [facility] Discharge Report Date of Discharge: 11/19/2020 Facility: [facility] Insurance: [facility] Medicare  Unsuccessful outreach attempt #2 to patient.      Plan: RN CM will make outreach attempt to patient within 3-4 business days.   [name], RN,BSN,CCM [facility] Care Management Telephonic Care Management Coordinator Direct Phone: [phone] Toll Free: [phone] Fax: [phone]

## 2020-11-25 ENCOUNTER — Other Ambulatory Visit: Payer: Self-pay

## 2020-11-25 NOTE — Patient Outreach (Signed)
[facility]) Care Management  11/25/2020  Laurie Jackson [birth year] [id number]    Transition of Care Referral  Referral Date:11/22/2020 Referral Source:[employer] Discharge Report Date of Discharge:11/19/2020 [facility] Insurance:[employer] Medicare   Unsuccessful outreach attempt # 3 to patient.    Plan: RN CM will make outreach attempt to patient within 3-4 wks if no response from letter mailed to patient.   [name], RN,BSN,CCM [facility] Care Management Telephonic Care Management Coordinator Direct Phone: [phone] Toll Free: [phone] Fax: [phone]

## 2020-12-14 ENCOUNTER — Telehealth: Payer: Self-pay | Admitting: Nurse Practitioner

## 2020-12-14 ENCOUNTER — Other Ambulatory Visit: Payer: Self-pay

## 2020-12-14 NOTE — Telephone Encounter (Signed)
Spoke with patient's daughter, Karren Burly, to offer to schedule a Palliative f/u visit (post discharged from [facility]) and she stated that patient was admitted to Hospice services with [facility].  Will discharge patient from Palliative services.

## 2020-12-14 NOTE — Patient Outreach (Signed)
[facility]) Care Management  12/14/2020  Laurie Jackson 1941/04/09 [id number]   Transition of Care Referral  Referral Date:11/22/2020 Referral Source:[employer] Discharge Report Date of Discharge:11/19/2020 Facility:[facility] Insurance:[employer] Medicare   Multiple attempts to establish contact with patient without success. No response from letter mailed to patient. Noted in EMR patient referred for hospice services. Case is being closed at this time.     Plan: RN CM will close case.   [name], RN,BSN,CCM [facility] Care Management Telephonic Care Management Coordinator Direct Phone: [phone] Toll Free: [phone] Fax: [phone]

## 2020-12-16 ENCOUNTER — Ambulatory Visit: Payer: Self-pay

## 2021-12-31 IMAGING — CT CT CERVICAL SPINE W/O CM
4 of 8 series · 12 of 33 positions shown, 13 images · non-contrast
Comparison: CT head July 23, 2014.

CLINICAL DATA: Trauma

EXAM:
CT HEAD WITHOUT CONTRAST
CT CERVICAL SPINE WITHOUT CONTRAST
TECHNIQUE: Multidetector CT imaging of the head and cervical spine was
performed following the standard protocol without intravenous
contrast. Multiplanar CT image reconstructions of the cervical spine
were also generated.

[Series 10: sagittal bone · sagittal · 0.25mm/px · 5 of 59 slices shown]
[im 10/59  bone]
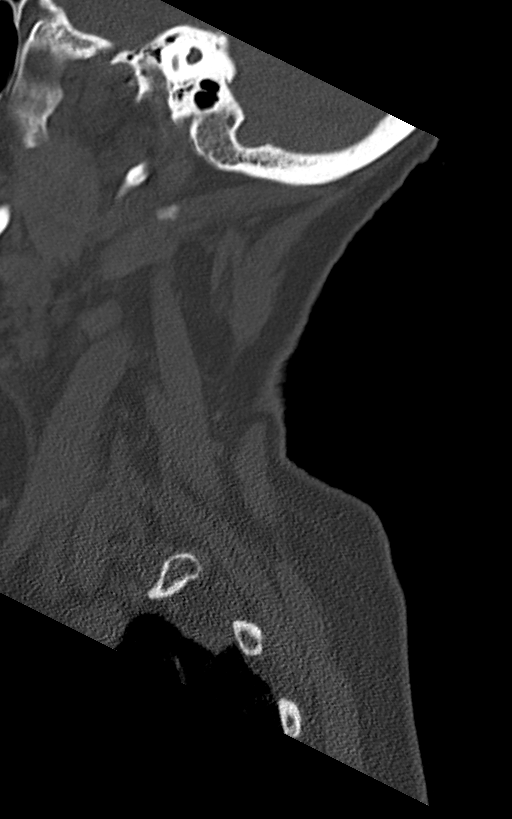
[im 20/59  bone]
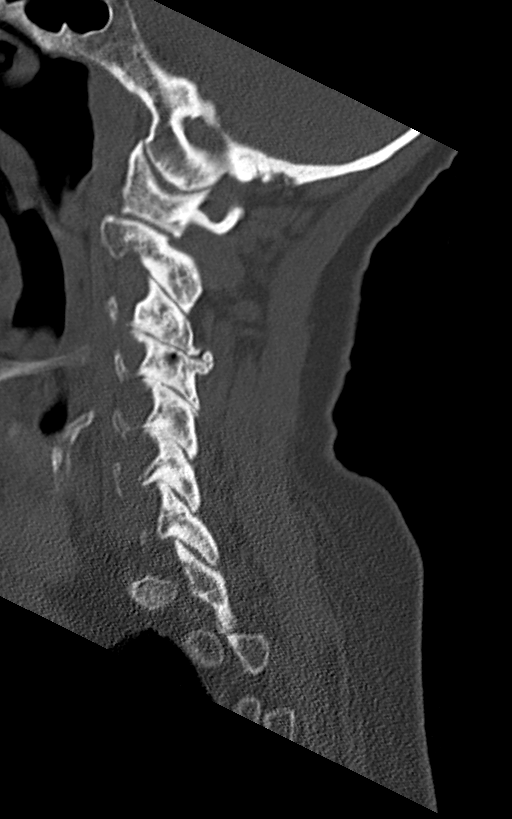
[im 30/59  bone]
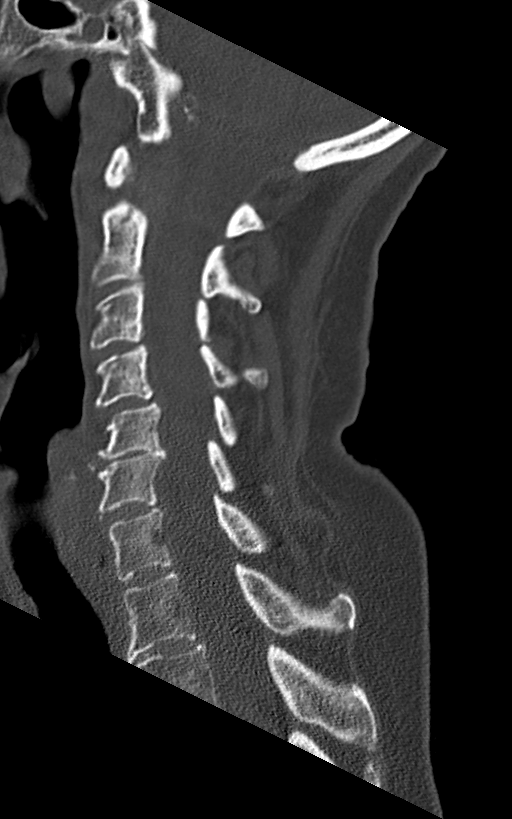
[im 39/59  bone]
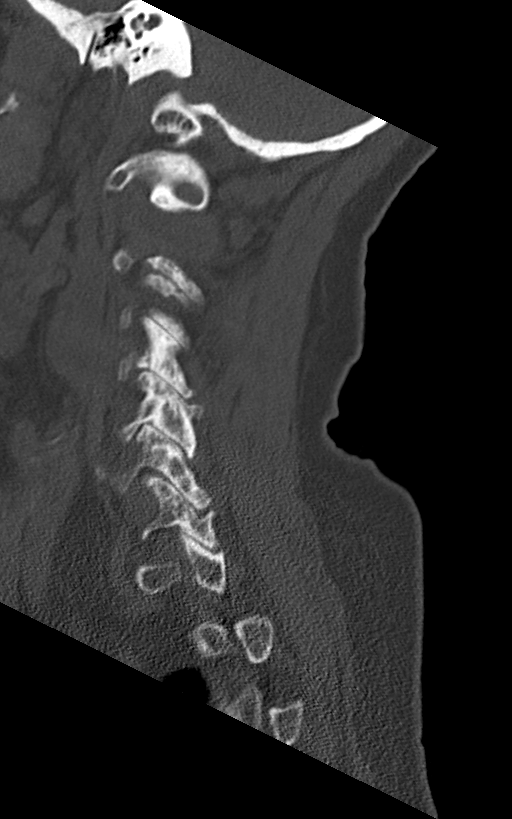
[im 49/59  bone]
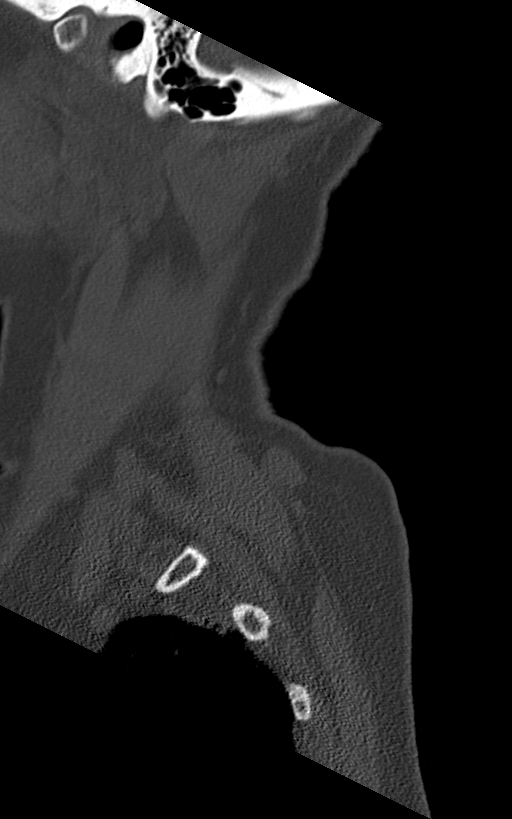

[Series 11: coronal bone · coronal · 0.22mm/px · 3 of 52 slices shown]
[im 3/52  bone]
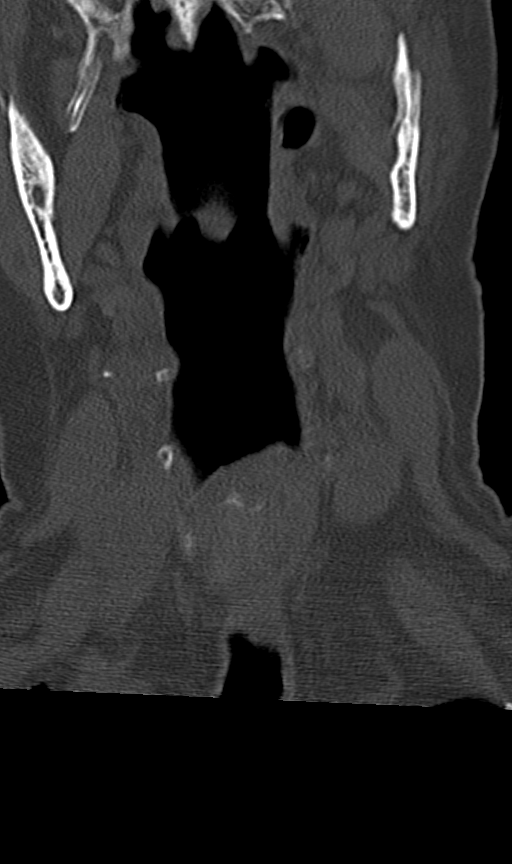
[im 26/52  bone]
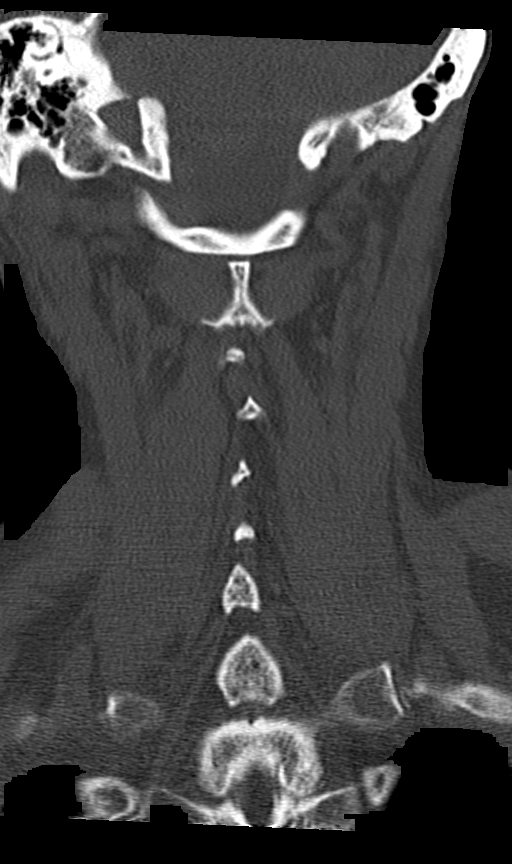
[im 50/52  bone]
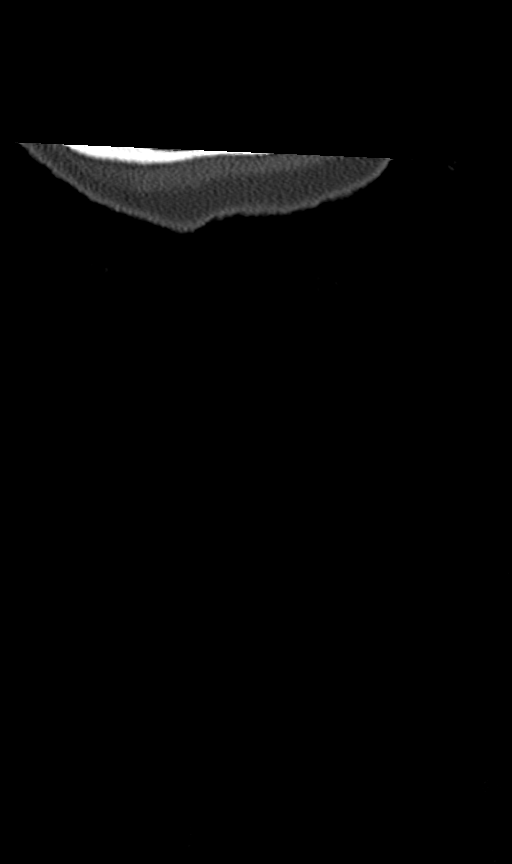

[Series 12: orthogonal bone · axial · 0.29mm/px · z∈[-276,-223]mm · 2 of 97 slices shown, 3 images (1 of 2)]
[im 33/97  soft-tissue]
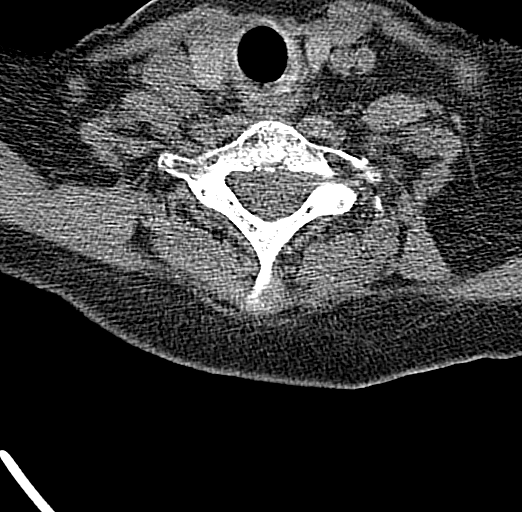
[im 33/97  bone]
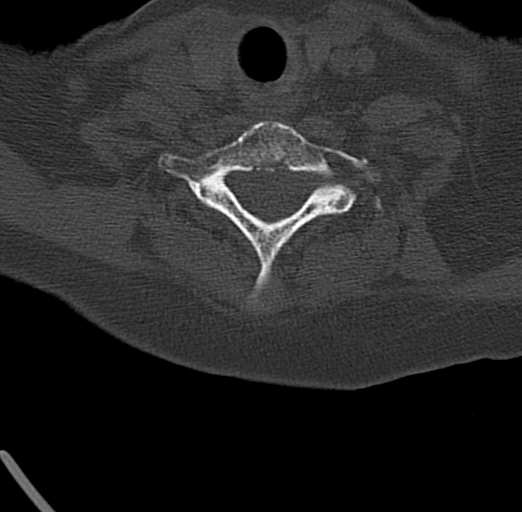
[im 65/97  bone]
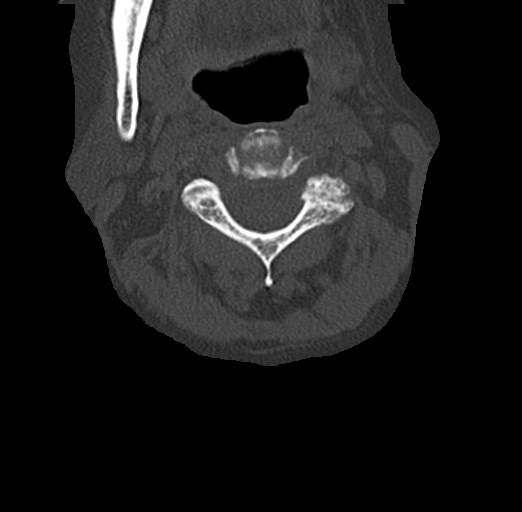

[Series 15: orthogonal bone · axial · 0.29mm/px · z∈[-266,-218]mm · 2 of 88 slices shown (2 of 2)]
[im 30/88  bone]
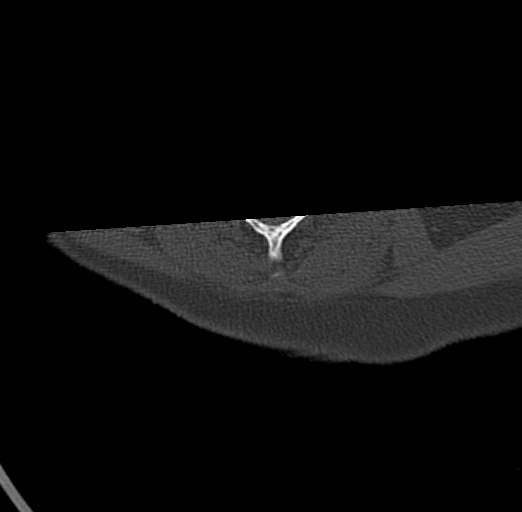
[im 59/88  bone]
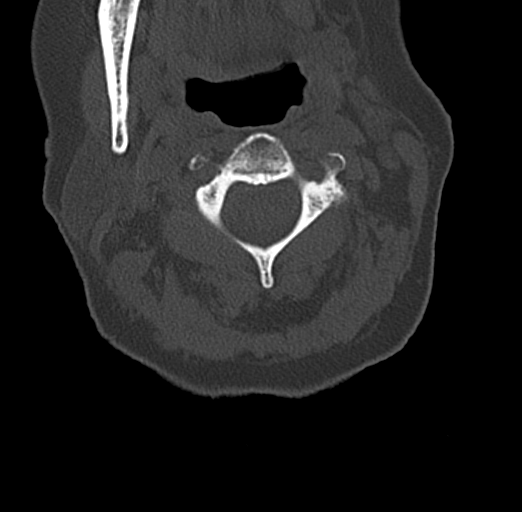

[12 of 33 positions shown; findings below may reference images not displayed]

FINDINGS: CT HEAD FINDINGS

Brain: No evidence of acute large vascular territory infarction,
hemorrhage, hydrocephalus, extra-axial collection or mass
lesion/mass effect. Patchy white matter hypoattenuation, most likely
related to chronic microvascular ischemic disease. Generalized
cerebral volume loss with ex vacuo ventricular dilation.

Vascular: Calcific atherosclerosis.

Skull: Left frontal/periorbital scalp contusion without acute
fracture.

Sinuses/Orbits: Visualized sinuses are clear. Visualized orbits are
unremarkable.

Other: No mastoid effusions.

CT CERVICAL SPINE FINDINGS

Alignment: Mild stepwise anterolisthesis of C3 on C4 and C4 on C5
and slight retrolisthesis of C5 on C6, favored degenerative given
facet arthropathy at these levels.

Skull base and vertebrae: No evidence of acute fracture. Vertebral
body heights are maintained.

Soft tissues and spinal canal: No prevertebral fluid or swelling. No
visible canal hematoma.

Disc levels: Mild-to-moderate degenerative disc disease at C5-C6
with disc space height loss and bony spurring. Multilevel facet
arthropathy. Disc bulge at C4-C5 effaces ventral CSF and may contact
the ventral cord. No evidence of significant bony canal stenosis.

Upper chest: No acute findings. Biapical pleuroparenchymal scarring.
IMPRESSION: CT head:

1. No evidence of acute intracranial abnormality.
2. Left frontal/periorbital scalp contusion without acute fracture.
3. Chronic microvascular ischemic disease and generalized cerebral
volume loss.

CT cervical spine:

1. No evidence of acute fracture or traumatic malalignment.
2. Multilevel degenerative change, as detailed above.

## 2022-06-21 DIAGNOSIS — F0280 Dementia in other diseases classified elsewhere without behavioral disturbance: Secondary | ICD-10-CM | POA: Diagnosis not present

## 2022-06-21 DIAGNOSIS — F0150 Vascular dementia without behavioral disturbance: Secondary | ICD-10-CM | POA: Diagnosis not present

## 2022-06-21 DIAGNOSIS — E7800 Pure hypercholesterolemia, unspecified: Secondary | ICD-10-CM | POA: Diagnosis not present

## 2022-06-21 DIAGNOSIS — G309 Alzheimer's disease, unspecified: Secondary | ICD-10-CM | POA: Diagnosis not present

## 2022-06-21 DIAGNOSIS — D649 Anemia, unspecified: Secondary | ICD-10-CM | POA: Diagnosis not present

## 2022-06-21 DIAGNOSIS — I10 Essential (primary) hypertension: Secondary | ICD-10-CM | POA: Diagnosis not present

## 2022-06-28 ENCOUNTER — Telehealth: Payer: Self-pay

## 2022-06-28 NOTE — Telephone Encounter (Signed)
Spoke with patient's daughter Andria Frames and scheduled a Mychart Palliative Consult for 07/04/22 @ 2:30 PM.  Consent obtained; updated Netsmart, Team List and Epic.

## 2022-07-04 ENCOUNTER — Encounter: Payer: Self-pay | Admitting: Nurse Practitioner

## 2022-07-04 ENCOUNTER — Telehealth: Payer: Medicare HMO | Admitting: Nurse Practitioner

## 2022-07-04 DIAGNOSIS — F0150 Vascular dementia without behavioral disturbance: Secondary | ICD-10-CM | POA: Diagnosis not present

## 2022-07-04 DIAGNOSIS — Z515 Encounter for palliative care: Secondary | ICD-10-CM | POA: Diagnosis not present

## 2022-07-04 DIAGNOSIS — G309 Alzheimer's disease, unspecified: Secondary | ICD-10-CM | POA: Diagnosis not present

## 2022-07-04 DIAGNOSIS — F0280 Dementia in other diseases classified elsewhere without behavioral disturbance: Secondary | ICD-10-CM | POA: Diagnosis not present

## 2022-07-04 NOTE — Progress Notes (Signed)
[facility] Consult Note Telephone: [phone]  Fax: [phone]   Date of encounter: 07/04/22 2:52 PM PATIENT NAME: Laurie Jackson [place] [address]   [phone] (home)  DOB: 1941-05-03 MRN: [id number] PRIMARY CARE PROVIDER:    Idelle Crouch, MD,  [address] [place] [address] [phone]  REFERRING PROVIDER:   Idelle Crouch, MD [facility] [place],  Laurie Jackson [phone]  RESPONSIBLE PARTY:    Contact Information     Name Relation Home Work [place], [place] Daughter   [phone]   Laurie Jackson, Laurie Jackson [id number]  [phone]       Due to the COVID-19 crisis, this visit was done via telemedicine from my office and it was initiated and consent by this patient and or family.  I connected with daughter Laurie Jackson with Laurie Jackson OR [facility]OX[facility] on 07/04/22 by a video enabled telemedicine application and verified that I am speaking with the correct person using two identifiers.   I discussed the limitations of evaluation and management by telemedicine. The patient expressed understanding and agreed to proceed. Palliative Care was asked to follow this patient by consultation request of  Sparks, Leonie Douglas, MD to address advance care planning and complex medical decision making. This is the initial visit.                              ASSESSMENT AND PLAN / RECOMMENDATIONS:  Symptom Management/Plan: 1. Advance Care Planning; Just d/c from hospice services due to stability;   NP PC to sign off as stable, currently asymptomatic, will defer further visits to [facility] RN/PC SW. Also referred to [facility] for in home primary care due to home bound status.   2. Goals of Care: Goals include to maximize quality of life and symptom management. Our advance care planning conversation included a discussion about:    The value and importance  of advance care planning  Exploration of personal, cultural or spiritual beliefs that might influence medical decisions  Exploration of goals of care in the event of a sudden injury or illness  Identification and preparation of a healthcare agent  Review and updating or creation of an advance directive document.  3. Alzheimer's/vascular dementia, progressive currently stable. Discussed disease progression, expectations, recent events of stability with hospice d/c. Discussed nutrition, pureed diet, monitor for aspiration precautions.   4. Palliative care encounter; Palliative care encounter; Palliative medicine team will continue to support patient, patient's family, and medical team. Visit consisted of counseling and education dealing with the complex and emotionally intense issues of symptom management and palliative care in the setting of serious and potentially life-threatening illness I spent 36 minutes providing this consultation. More than 50% of the time in this consultation was spent in counseling and care coordination. PPS: 30%  Chief Complaint: Initial palliative consult for complex medical decision making, address goals, manage ongoing symptoms  HISTORY OF PRESENT ILLNESS:  Laurie Jackson is a 81 y.o. year old female  with multiple medical problems including Mixed Alzheimers and vascular dementia, paroxysmal afib, h/o lucunar cerebrovascular accident, h/o uti's, functional quadriplegic, HTN, OA, chronic anemia, hypercholesterolemia. Laurie Jackson was recently d/c from hospice services due to stability. I connected with daughter Laurie Jackson for initial telemedicine visit with Laurie Jackson. We talked about past medical history, social, family, ros, functional impairments. Laurie Jackson is total care for  bathing, dressing, toileting, assists with feeding requiring pureed diet. Laurie Jackson has to be turned, positioned. Laurie Jackson appetite has been fair, no noted weight loss, no wounds. We talked  about medical goals, role pc in poc. We talked about f/u visit will be done by [facility] RN/SW. We talked about in home primary provider, will refer to [facility]. Therapeutic listening, emotional support provided. We talked about resources including Social services to see about a grant program for caregivers, [place] [facility]. Questions answered. Contact information provided.   History obtained from review of EMR, discussion with primary team, and interview with family, facility staff/caregiver and/or Laurie Jackson.  I reviewed available labs, medications, imaging, studies and related documents from the EMR.  Records reviewed and summarized above.   ROS 10 point system reviewed negative except HPI  Physical Exam: deferred CURRENT PROBLEM LIST:  Patient Active Problem List   Diagnosis Date Noted   UTI (urinary tract infection) 09/29/2020   Severe sepsis (HCC) 09/28/2020   AKI (acute kidney injury) (HCC) 09/28/2020   Sacral decubitus ulcer 09/28/2020   Cellulitis 09/28/2020   Atrial fibrillation, chronic (HCC) 09/28/2020   AF (paroxysmal atrial fibrillation) (HCC) 09/28/2020   Chronic anticoagulation 09/28/2020   Functional quadriplegia (HCC) 09/28/2020   History of lacunar cerebrovascular accident 09/28/2020   Lacunar infarction (HCC) 12/03/2019   Mixed Alzheimer's and vascular dementia (HCC) 12/03/2019   Chronic anemia 12/19/2018   Hand pain 05/29/2016   Primary osteoarthritis of right knee 12/29/2014   Benign essential hypertension 02/13/2014   Pure hypercholesterolemia 02/13/2014   PAST MEDICAL HISTORY:  Active Ambulatory Problems    Diagnosis Date Noted   Benign essential hypertension 02/13/2014   Chronic anemia 12/19/2018   Hand pain 05/29/2016   Lacunar infarction (HCC) 12/03/2019   Mixed Alzheimer's and vascular dementia (HCC) 12/03/2019   Primary osteoarthritis of right knee 12/29/2014   Pure hypercholesterolemia 02/13/2014   Severe sepsis (HCC) 09/28/2020   AKI (acute  kidney injury) (HCC) 09/28/2020   Sacral decubitus ulcer 09/28/2020   Cellulitis 09/28/2020   Atrial fibrillation, chronic (HCC) 09/28/2020   AF (paroxysmal atrial fibrillation) (HCC) 09/28/2020   Chronic anticoagulation 09/28/2020   Functional quadriplegia (HCC) 09/28/2020   History of lacunar cerebrovascular accident 09/28/2020   UTI (urinary tract infection) 09/29/2020   Resolved Ambulatory Problems    Diagnosis Date Noted   No Resolved Ambulatory Problems   Past Medical History:  Diagnosis Date   Dementia (HCC)    HTN (hypertension)    OA (osteoarthritis)    Paroxysmal A-fib (HCC)    SOCIAL HX:  Social History   Tobacco Use   Smoking status: Never   Smokeless tobacco: Never  Substance Use Topics   Alcohol use: Never   FAMILY HX:  Family History  Problem Relation Age of Onset   Breast cancer Neg Hx       ALLERGIES:  Allergies  Allergen Reactions   Penicillin G Other (See Comments)    Drunk feeling/weak     PERTINENT MEDICATIONS:  Outpatient Encounter Medications as of 07/04/2022  Medication Sig   apixaban (ELIQUIS) 2.5 MG TABS tablet TAKE 1 TABLET (2.5 MG TOTAL) BY MOUTH EVERY 12 (TWELVE) HOURS   diclofenac Sodium (VOLTAREN) 1 % GEL Apply 2 g topically 4 (four) times daily.   donepezil (ARICEPT) 10 MG tablet Take 10 mg by mouth daily.   feeding supplement (ENSURE ENLIVE / ENSURE PLUS) LIQD Take 237 mLs by mouth 2 (two) times daily between meals.   ferrous sulfate 325 (  65 FE) MG tablet Take 325 mg by mouth 2 (two) times daily.   metoprolol tartrate (LOPRESSOR) 100 MG tablet Take 1 tablet (100 mg total) by mouth 2 (two) times daily.   Multiple Vitamin (MULTI-VITAMIN) tablet Take 1 tablet by mouth daily.   predniSONE (DELTASONE) 2.5 MG tablet Take 1 tablet (2.5 mg total) by mouth 2 (two) times daily with a meal.   No facility-administered encounter medications on file as of 07/04/2022.   Thank you for the opportunity to participate in the care of Laurie Jackson.   The palliative care team will continue to follow. Please call our office at [phone] if we can be of additional assistance.   Shedric Fredericks Z Shabre Kreher, NP ,

## 2022-07-08 DIAGNOSIS — F0280 Dementia in other diseases classified elsewhere without behavioral disturbance: Secondary | ICD-10-CM | POA: Diagnosis not present

## 2022-07-08 DIAGNOSIS — G309 Alzheimer's disease, unspecified: Secondary | ICD-10-CM | POA: Diagnosis not present

## 2022-07-08 DIAGNOSIS — F0150 Vascular dementia without behavioral disturbance: Secondary | ICD-10-CM | POA: Diagnosis not present

## 2022-07-21 ENCOUNTER — Telehealth: Payer: Self-pay

## 2022-07-21 NOTE — Telephone Encounter (Signed)
330 pm.  Message received from Chanda-daughter requesting a call back to schedule a home visit with PC.  Return call made and no answer.  Message left requesting a call back.

## 2022-07-25 ENCOUNTER — Other Ambulatory Visit: Payer: Medicare HMO

## 2022-07-25 VITALS — BP 162/98 | HR 76 | Temp 97.5°F

## 2022-07-25 DIAGNOSIS — Z515 Encounter for palliative care: Secondary | ICD-10-CM

## 2022-07-25 NOTE — Progress Notes (Signed)
PATIENT NAME: Laurie Jackson DOB: [birth year] MRN: [id number]  PRIMARY CARE PROVIDER: Marguarite Arbour, MD  RESPONSIBLE PARTY:  Acct ID - Guarantor Home Phone Work Phone Relationship Acct Type  [id number] [facility]* [phone]  Self P/F     [address], [place], [place] [address]   Home visit completed with patient and spouse.  Patient is found in the bed awake and verbal.  Just completed lunch being fed by spouse.  Cough:  Now resolved.  Spouse and patient report a cough without production for 2 weeks recently.  Patient denies any further issues.   Dementia:  Spouse states patient has become more engaging in conversation over the last month or so.  He does not feel there has been any changes in patient since she was discharged from [facility].  No skin breakdown reported.  Patient is bed-bound, unable to feed herself, incontinent of bowel and bladder.  Patient no longer has a hoyer lift and has remained in bed from almost 2 months.  She has a wheelchair in the home but the lift was sent back since patient was remaining in bed.  Appetite has remained fair with 2 meals daily and 1-2 cans of ensure.  No swallowing difficulties noted by spouse.  Flu Vaccine:  Spouse has requested a flu shot.  I have left a message with Laurie Jackson at [facility] requesting follow up with daughter Laurie Jackson to schedule.  HTN:  Blood pressure elevated today.  No longer taking anti-hypertensive medications.  Per spouse, blood pressures have been good.  No recall on blood pressure machine.  Spouse advised to take patient's blood pressure daily this week to see if they are staying elevated.  Will follow up with PCP if needed.  Follow up visit scheduled for next month.  Phone call made to daughter Laurie Jackson to provide an update.  Message received from [place] that she would call back.    PHYSICAL EXAM:   VITALS: Today's Vitals   07/25/22 1116  BP: (!) 162/98  Pulse: 76  Temp: (!)  97.5 F (36.4 C)  SpO2: 96%  PainSc: 0-No pain    LUNGS: clear to auscultation  CARDIAC: Cor RRR}  EXTREMITIES: - for edema SKIN: Skin color, texture, turgor normal. No rashes or lesions or mobility and turgor normal  NEURO: positive for gait problems and memory problems       Laurie Merle, RN

## 2022-07-31 ENCOUNTER — Ambulatory Visit (LOCAL_COMMUNITY_HEALTH_CENTER): Payer: Medicare HMO

## 2022-07-31 DIAGNOSIS — Z23 Encounter for immunization: Secondary | ICD-10-CM

## 2022-07-31 DIAGNOSIS — Z719 Counseling, unspecified: Secondary | ICD-10-CM

## 2022-07-31 NOTE — Progress Notes (Signed)
Client scheduled for home visit today for Covid-19 and Flu vaccine.  Daughter Laurie Jackson called and reviewed Screening questions.   Laurie Jackson spouse will be with the client in the home at the time of the visit.   VIS for Covid-19 and Flu vaccine provided to the client.     Are you feeling sick today? No   Have you ever received a dose of COVID-19 Vaccine? [employer], [place], [place], [place], Other) Yes  If yes, which vaccine and how many doses?    3 Pfizer   Did you bring the vaccination record card or other documentation?  No   Do you have a health condition or are undergoing treatment that makes you moderately or severely immunocompromised? This would include, but not be limited to: cancer, HIV, organ transplant, immunosuppressive therapy/high-dose corticosteroids, or moderate/severe primary immunodeficiency.  No  Have you received COVID-19 vaccine before or during hematopoietic cell transplant (HCT) or CAR-T-cell therapies? No  Have you ever had an allergic reaction to: (This would include a severe allergic reaction or a reaction that caused hives, swelling, or respiratory distress, including wheezing.) A component of a COVID-19 vaccine or a previous dose of COVID-19 vaccine? No   Have you ever had an allergic reaction to another vaccine (other thanCOVID-19 vaccine) or an injectable medication? (This would include a severe allergic reaction or a reaction that caused hives, swelling, or respiratory distress, including wheezing.)   No    Do you have a history of any of the following:  Myocarditis or Pericarditis No  Dermal fillers:  No  Multisystem Inflammatory Syndrome (MIS-C or MIS-A)? No  COVID-19 disease within the past 3 months? No  Vaccinated with monkeypox vaccine in the last 4 weeks? No    Mr. Laurie Jackson greeted [place] at the door.  He brought [place] back to Laurie Jackson.  Laurie Jackson was able to tell [place] her birth month and year.   Mr. Laurie Jackson signed the consent for  vaccines.  VIS given for Flu and Covid-19.    Vaccines administered by R. Spruill RN.  Client tolerated well.  Client observed x 15 minutes.   Covid-19 card and flu card provided.   Laurie Saunders Sherrilyn Rist, RN

## 2022-08-08 ENCOUNTER — Telehealth: Payer: Self-pay

## 2022-08-08 DIAGNOSIS — G309 Alzheimer's disease, unspecified: Secondary | ICD-10-CM | POA: Diagnosis not present

## 2022-08-08 DIAGNOSIS — F0150 Vascular dementia without behavioral disturbance: Secondary | ICD-10-CM | POA: Diagnosis not present

## 2022-08-08 DIAGNOSIS — F0280 Dementia in other diseases classified elsewhere without behavioral disturbance: Secondary | ICD-10-CM | POA: Diagnosis not present

## 2022-08-08 NOTE — Telephone Encounter (Signed)
910 am.  Message received from [place] requesting a call back regarding medications.  Spoke with Karren Burly who is asking for refills on aricept and iron tabs.  Advised that NP is no longer following home care patients and only RN/SW team is in place now.  Medication refills would need to go through PCP.  We discussed home based podiatry options for patient and information provided on [facility] in [place].

## 2022-08-15 ENCOUNTER — Other Ambulatory Visit: Payer: Medicare HMO

## 2022-08-15 VITALS — BP 152/84 | HR 65 | Temp 97.7°F

## 2022-08-15 DIAGNOSIS — Z515 Encounter for palliative care: Secondary | ICD-10-CM

## 2022-08-15 NOTE — Progress Notes (Signed)
PATIENT NAME: Laurie Jackson DOB: 30-Jun-1941 MRN: [id number]  PRIMARY CARE PROVIDER: Marguarite Arbour, MD  RESPONSIBLE PARTY:  Acct ID - Guarantor Home Phone Work Phone Relationship Acct Type  [id number] [facility]* [id number]  Self P/F     [address], [place], [place] [address]    Appetite:  Mid-arm circumference 21 cm today.  Poor appetite today.  Only taking  ensure but spouse states appetite typically improves as the day progresses.   Dementia: Total assist with all ADL's.  Incontinent of bowel and bladder.  Patient will engage in short conversation and answer questions appropriately.  Overall, spouse feels patient is doing well.  No new concerns today.  Podiatry:  Spouse advised daughter was able to connect with [facility] and a home visit is to be made this week.      PHYSICAL EXAM:   VITALS: Today's Vitals   08/15/22 1231  BP: (!) 152/84  Pulse: 65  Temp: 97.7 F (36.5 C)  SpO2: 97%    LUNGS: clear to auscultation  CARDIAC: Cor RRR}  EXTREMITIES: - for edema SKIN: Skin color, texture, turgor normal. No rashes or lesions or normal  NEURO: positive for gait problems and memory problems       Truitt Merle, RN

## 2022-08-29 ENCOUNTER — Other Ambulatory Visit: Payer: Medicare HMO

## 2022-08-29 VITALS — BP 160/100 | HR 64 | Temp 97.6°F

## 2022-08-29 DIAGNOSIS — Z515 Encounter for palliative care: Secondary | ICD-10-CM

## 2022-08-29 NOTE — Progress Notes (Signed)
PATIENT NAME: Laurie Jackson DOB: Oct 30, 1940 MRN: [id number]  PRIMARY CARE PROVIDER: Marguarite Arbour, MD  RESPONSIBLE PARTY:  Acct ID - Guarantor Home Phone Work Phone Relationship Acct Type  [id number] [facility]* [phone]  Self P/F     [address], [place], [place] [address]   Appetite:  Doing well today.  Ate a good breakfast that spouse fed her.  Continues on pureed consistency with thin liquids.  Tolerating well.  Dementia:  More engaging on visits, smiling and laughing with her spouse.  Continues to be bed bound, requiring full assistance with all adl's including feeding.  Patient is incontinent of bowel and bladder.  No skin breakdown reported.   Sleeping well during the night will occasionally nap during the day.    HTN:  Blood pressure remains elevated.  Spouse has not been able to obtain a blood pressure cuff to monitor readings.  Patient is not on any HTN medications.  Phone call made to PCP during this visit to advise of last 3 bp readings.   Podiatry:  Was not able to be seen by [facility] per spouse.  Follow up visit scheduled for next month.   CODE STATUS: Spouse believes DNR but uncertain will follow up with daughter Laurie Jackson. ADVANCED DIRECTIVES: Yes-daughter MOST FORM: No PPS: 30%   PHYSICAL EXAM:   VITALS: Today's Vitals   08/29/22 1134  BP: (!) 160/100  Pulse: 64  Temp: 97.6 F (36.4 C)  SpO2: 97%    LUNGS: clear to auscultation  CARDIAC: Cor RRR} EXTREMITIES: - for edema SKIN: Skin color, texture, turgor normal. No rashes or lesions or mobility and turgor normal  NEURO: positive for gait problems and memory problems       Truitt Merle, RN

## 2022-09-01 ENCOUNTER — Telehealth: Payer: Self-pay

## 2022-09-01 NOTE — Telephone Encounter (Signed)
1030 am.  Message received from daughter Doyne Keel following up on blood pressures and if any changes were needed in medications.  Phone call made to Dr. Judithann Sheen office to follow up on call from 12/19.  Spoke with Happy and message sent to PCP for follow up.  Response pending.

## 2022-09-07 DIAGNOSIS — F0150 Vascular dementia without behavioral disturbance: Secondary | ICD-10-CM | POA: Diagnosis not present

## 2022-09-07 DIAGNOSIS — F0280 Dementia in other diseases classified elsewhere without behavioral disturbance: Secondary | ICD-10-CM | POA: Diagnosis not present

## 2022-09-07 DIAGNOSIS — G309 Alzheimer's disease, unspecified: Secondary | ICD-10-CM | POA: Diagnosis not present

## 2022-09-21 ENCOUNTER — Other Ambulatory Visit: Payer: Medicare HMO

## 2022-09-21 DIAGNOSIS — R54 Age-related physical debility: Secondary | ICD-10-CM | POA: Diagnosis not present

## 2022-09-21 DIAGNOSIS — D509 Iron deficiency anemia, unspecified: Secondary | ICD-10-CM | POA: Diagnosis not present

## 2022-09-21 DIAGNOSIS — K5900 Constipation, unspecified: Secondary | ICD-10-CM | POA: Diagnosis not present

## 2022-09-21 DIAGNOSIS — G311 Senile degeneration of brain, not elsewhere classified: Secondary | ICD-10-CM | POA: Diagnosis not present

## 2022-09-26 ENCOUNTER — Other Ambulatory Visit: Payer: Medicare HMO

## 2022-09-26 VITALS — BP 122/80 | HR 77 | Temp 97.7°F

## 2022-09-26 DIAGNOSIS — Z515 Encounter for palliative care: Secondary | ICD-10-CM

## 2022-09-26 NOTE — Progress Notes (Signed)
PATIENT NAME: Laurie Jackson DOB: 05/31/1941 MRN: [id number]  PRIMARY CARE PROVIDER: Idelle Crouch, MD  RESPONSIBLE PARTY:  Acct ID - Guarantor Home Phone Work Phone Relationship Acct Type  [id number] [phone]  Self P/F     [address], [place], [place] [address]   Home visit completed with patient and spouse.   Appetite:  Eating about about 3 meals a day with small portion sizes.   Drinking Ensure is consumed 2-3 x a daily.  No coughing reported with food or liquids.  Mid-arm circumference remains at 21 cm to the left arm.    Dementia:  FAST Score 7C.  Patient will engage for short conversations.  Mostly yes/no responses.  Bed-bound and unable to hold herself upright.  Contractures present to bilateral arms and feet.  Incontinent of bowel and bladder.  Must be fed meals.  No recent infections.  [facility]:  Patient is now established with [facility] a home-based provider.  Visit was made last week to establish services per spouse.   Hypertension:  Blood pressures are back down.  Family has not obtained blood pressure cuff but was checked last week by [facility] nurse and reading has improved.   Podiatry:  Spouse continues to look for a home based podiatrist as patient is home-bound.  He and patient's daughter are doing nail care and toenails have been recently trimmed.  Skin breakdown:  Stage 1 to sacrum and coccyx.  No open areas present today.  Continue using barrier cream, changing every 2-3 hours and repositioning frequently.   Patient has alternating pressure pad on bed.    PPS 30%  PHYSICAL EXAM:   VITALS: Today's Vitals   09/26/22 0923  BP: 122/80  Pulse: 77  Temp: 97.7 F (36.5 C)  SpO2: 94%    LUNGS: clear to auscultation  CARDIAC: Cor RRR}  EXTREMITIES: - for edema SKIN: Skin color, texture, turgor normal. No rashes or lesions or Stage 1 to sacrum and coccyx   NEURO: + gait issues, memory and weakness       Lorenza Burton, RN

## 2022-10-08 DIAGNOSIS — F0280 Dementia in other diseases classified elsewhere without behavioral disturbance: Secondary | ICD-10-CM | POA: Diagnosis not present

## 2022-10-08 DIAGNOSIS — G309 Alzheimer's disease, unspecified: Secondary | ICD-10-CM | POA: Diagnosis not present

## 2022-10-08 DIAGNOSIS — F0150 Vascular dementia without behavioral disturbance: Secondary | ICD-10-CM | POA: Diagnosis not present

## 2022-10-11 DIAGNOSIS — E7800 Pure hypercholesterolemia, unspecified: Secondary | ICD-10-CM | POA: Diagnosis not present

## 2022-10-11 DIAGNOSIS — G311 Senile degeneration of brain, not elsewhere classified: Secondary | ICD-10-CM | POA: Diagnosis not present

## 2022-10-11 DIAGNOSIS — R54 Age-related physical debility: Secondary | ICD-10-CM | POA: Diagnosis not present

## 2022-10-11 DIAGNOSIS — I480 Paroxysmal atrial fibrillation: Secondary | ICD-10-CM | POA: Diagnosis not present

## 2022-10-11 DIAGNOSIS — I10 Essential (primary) hypertension: Secondary | ICD-10-CM | POA: Diagnosis not present

## 2022-10-11 DIAGNOSIS — D509 Iron deficiency anemia, unspecified: Secondary | ICD-10-CM | POA: Diagnosis not present

## 2022-10-12 DIAGNOSIS — R54 Age-related physical debility: Secondary | ICD-10-CM | POA: Diagnosis not present

## 2022-10-12 DIAGNOSIS — D509 Iron deficiency anemia, unspecified: Secondary | ICD-10-CM | POA: Diagnosis not present

## 2022-10-12 DIAGNOSIS — E7800 Pure hypercholesterolemia, unspecified: Secondary | ICD-10-CM | POA: Diagnosis not present

## 2022-10-12 DIAGNOSIS — I10 Essential (primary) hypertension: Secondary | ICD-10-CM | POA: Diagnosis not present

## 2022-10-12 DIAGNOSIS — G311 Senile degeneration of brain, not elsewhere classified: Secondary | ICD-10-CM | POA: Diagnosis not present

## 2022-10-12 DIAGNOSIS — I480 Paroxysmal atrial fibrillation: Secondary | ICD-10-CM | POA: Diagnosis not present

## 2022-10-12 DIAGNOSIS — Z8673 Personal history of transient ischemic attack (TIA), and cerebral infarction without residual deficits: Secondary | ICD-10-CM | POA: Diagnosis not present

## 2022-10-12 DIAGNOSIS — F01518 Vascular dementia, unspecified severity, with other behavioral disturbance: Secondary | ICD-10-CM | POA: Diagnosis not present

## 2022-10-15 DIAGNOSIS — D6859 Other primary thrombophilia: Secondary | ICD-10-CM | POA: Diagnosis not present

## 2022-10-15 DIAGNOSIS — R32 Unspecified urinary incontinence: Secondary | ICD-10-CM | POA: Diagnosis not present

## 2022-10-15 DIAGNOSIS — I482 Chronic atrial fibrillation, unspecified: Secondary | ICD-10-CM | POA: Diagnosis not present

## 2022-10-15 DIAGNOSIS — R159 Full incontinence of feces: Secondary | ICD-10-CM | POA: Diagnosis not present

## 2022-10-15 DIAGNOSIS — F01C0 Vascular dementia, severe, without behavioral disturbance, psychotic disturbance, mood disturbance, and anxiety: Secondary | ICD-10-CM | POA: Diagnosis not present

## 2022-10-15 DIAGNOSIS — I10 Essential (primary) hypertension: Secondary | ICD-10-CM | POA: Diagnosis not present

## 2022-10-15 DIAGNOSIS — R532 Functional quadriplegia: Secondary | ICD-10-CM | POA: Diagnosis not present

## 2022-10-15 DIAGNOSIS — H01006 Unspecified blepharitis left eye, unspecified eyelid: Secondary | ICD-10-CM | POA: Diagnosis not present

## 2022-10-15 DIAGNOSIS — H01003 Unspecified blepharitis right eye, unspecified eyelid: Secondary | ICD-10-CM | POA: Diagnosis not present

## 2022-10-16 DIAGNOSIS — F01518 Vascular dementia, unspecified severity, with other behavioral disturbance: Secondary | ICD-10-CM | POA: Diagnosis not present

## 2022-10-16 DIAGNOSIS — I10 Essential (primary) hypertension: Secondary | ICD-10-CM | POA: Diagnosis not present

## 2022-10-16 DIAGNOSIS — Z8673 Personal history of transient ischemic attack (TIA), and cerebral infarction without residual deficits: Secondary | ICD-10-CM | POA: Diagnosis not present

## 2022-10-16 DIAGNOSIS — D509 Iron deficiency anemia, unspecified: Secondary | ICD-10-CM | POA: Diagnosis not present

## 2022-10-19 ENCOUNTER — Other Ambulatory Visit: Payer: Medicare HMO

## 2022-10-19 VITALS — BP 150/90 | HR 62 | Temp 97.5°F

## 2022-10-19 DIAGNOSIS — Z515 Encounter for palliative care: Secondary | ICD-10-CM

## 2022-10-19 DIAGNOSIS — I10 Essential (primary) hypertension: Secondary | ICD-10-CM | POA: Diagnosis not present

## 2022-10-19 DIAGNOSIS — Z636 Dependent relative needing care at home: Secondary | ICD-10-CM | POA: Diagnosis not present

## 2022-10-19 DIAGNOSIS — R54 Age-related physical debility: Secondary | ICD-10-CM | POA: Diagnosis not present

## 2022-10-19 NOTE — Progress Notes (Signed)
PATIENT NAME: Laurie Jackson DOB: Dec 26, 1940 MRN: [id number]  PRIMARY CARE PROVIDER: Idelle Crouch, MD  RESPONSIBLE PARTY:  Acct ID - Guarantor Home Phone Work Phone Relationship Acct Type  [id number] [phone]  Self P/F     [address], [place], [place] [address]  Home visit completed with patient and spouse.   Connected with daughter [name] by phone.   Contractures:  Patient with bilateral hand contractures.  Spouse is doing passive range of motion to fingers.  They are placing a cloth in the palm of both hands during the night.  Discussed keeping nails trimmed short to ensure no skin breakdown to the palm of the hands.  Bilateral foot drop with contractures.  Unable to secure a home-based podiatrist but family is doing well keeping toenails filled and trimmed.  Dementia:  Patient incontinent of bowel and bladder.  Engages briefly in conversation and is able to answer questions appropriately.  Bed-bound and requires max assistance with ADL's.  Family is feeding patient soft/pureed foods.  She is drinking ensure 2-3 x daily.   No skin breakdown noted.  Eye Drainage:  Now using ocusoft eye scrub and this has been working well. No further issues with drainage.  HTN:  Spouse obtained bp cuff but this recently broke.  He has a wrist cuff but this does not register on patient.  We discussed returning the wrist cuff and looking for an adult small cuff.   Daughter advised she is ordering thick-it food products from [place] and requested I review for sodium content and if they need to change to another product.  Pastas and stews have [id number] mg of sodium per 1 cup (serving size).  Spouse advised patient may eat 1 cup total between 2 meals.  Eats oatmeal for breakfast and drinks ensure both which have very little sodium.  Patient is likely not exceeding recommended sodium intake daily and no need to change food products they are currently using.    Follow up visit scheduled for next  month.   CODE STATUS: DNR-form in the home. ADVANCED DIRECTIVES: Yes MOST FORM: No PPS: 30%   PHYSICAL EXAM:   VITALS: Today's Vitals   10/19/22 0913  BP: (!) 150/90  Pulse: 62  Temp: (!) 97.5 F (36.4 C)  SpO2: 97%    LUNGS: clear to auscultation  CARDIAC: Cor RRR}  EXTREMITIES: - for edema SKIN: Skin color, texture, turgor normal. No rashes or lesions or mobility and turgor normal  NEURO: positive for gait problems and memory problems       Lorenza Burton, RN

## 2022-10-30 DIAGNOSIS — D509 Iron deficiency anemia, unspecified: Secondary | ICD-10-CM | POA: Diagnosis not present

## 2022-10-30 DIAGNOSIS — E7800 Pure hypercholesterolemia, unspecified: Secondary | ICD-10-CM | POA: Diagnosis not present

## 2022-10-30 DIAGNOSIS — I10 Essential (primary) hypertension: Secondary | ICD-10-CM | POA: Diagnosis not present

## 2022-10-30 DIAGNOSIS — I480 Paroxysmal atrial fibrillation: Secondary | ICD-10-CM | POA: Diagnosis not present

## 2022-10-30 DIAGNOSIS — G311 Senile degeneration of brain, not elsewhere classified: Secondary | ICD-10-CM | POA: Diagnosis not present

## 2022-10-30 DIAGNOSIS — R54 Age-related physical debility: Secondary | ICD-10-CM | POA: Diagnosis not present

## 2022-11-08 DIAGNOSIS — F0280 Dementia in other diseases classified elsewhere without behavioral disturbance: Secondary | ICD-10-CM | POA: Diagnosis not present

## 2022-11-08 DIAGNOSIS — F0150 Vascular dementia without behavioral disturbance: Secondary | ICD-10-CM | POA: Diagnosis not present

## 2022-11-08 DIAGNOSIS — G309 Alzheimer's disease, unspecified: Secondary | ICD-10-CM | POA: Diagnosis not present

## 2022-11-14 ENCOUNTER — Other Ambulatory Visit: Payer: Medicare HMO

## 2022-11-14 VITALS — BP 162/92 | HR 87 | Temp 97.6°F

## 2022-11-14 DIAGNOSIS — Z515 Encounter for palliative care: Secondary | ICD-10-CM

## 2022-11-14 NOTE — Progress Notes (Signed)
PATIENT NAME: Laurie Jackson DOB: 02-13-1941 MRN: [id number]  PRIMARY CARE PROVIDER: Idelle Crouch, MD  RESPONSIBLE PARTY:  Acct ID - Guarantor Home Phone Work Phone Relationship Acct Type  [id number] [phone]  Self P/F     [address], [place], [place] [address]   Home visit completed with patient and private caregiver.  Dementia:  FAST 7C.   Patient will engage in short conversations.  Mostly related to past and cleaning her home.  Incontinent of bowel and bladder.  Remains bed bound.  Requires extensive assistance for turning to provide incontinence care and bathing.  Spouse is feeding patient.  Consistency remains soft to pureed.  No issues with swallowing reported by spouse.  Denies any issues with pain.  Spouse reports patient initially refused am medications but then agreed later in the morning.  Po intake was poor with patient refusal to take boost.  Spouse will try again this afternoon.  Instructed spouse to call if patient continues to refuse meals.    Contractures:  Patient has bilateral hand contractures.  Ongoing education provided to spouse.  Encouraged to keep rolled wash cloth in hands to keep them from closing further.   HTN:  Spouse checking blood pressures frequently.  Readings average in the 140's over 80's.  Currently not taking any HTN medications.   CODE STATUS: DNR-form in the home. ADVANCED DIRECTIVES: N MOST FORM: No PPS: 30%   PHYSICAL EXAM:   VITALS: Today's Vitals   11/14/22 1123  BP: (!) 162/92  Pulse: 87  Temp: 97.6 F (36.4 C)  SpO2: 96%    LUNGS: clear to auscultation  CARDIAC: Cor RRR}  EXTREMITIES: - for edema SKIN: Skin color, texture, turgor normal. No rashes or lesions or mobility and turgor normal  NEURO: positive for gait problems, memory problems, and weakness       Lorenza Burton, RN

## 2022-12-07 DIAGNOSIS — F0150 Vascular dementia without behavioral disturbance: Secondary | ICD-10-CM | POA: Diagnosis not present

## 2022-12-07 DIAGNOSIS — G309 Alzheimer's disease, unspecified: Secondary | ICD-10-CM | POA: Diagnosis not present

## 2022-12-07 DIAGNOSIS — F0280 Dementia in other diseases classified elsewhere without behavioral disturbance: Secondary | ICD-10-CM | POA: Diagnosis not present

## 2022-12-19 ENCOUNTER — Other Ambulatory Visit: Payer: Medicare HMO

## 2022-12-19 VITALS — BP 150/88 | HR 92 | Temp 97.5°F

## 2022-12-19 DIAGNOSIS — Z515 Encounter for palliative care: Secondary | ICD-10-CM

## 2022-12-19 NOTE — Progress Notes (Signed)
PATIENT NAME: Laurie Jackson DOB: [birth year] MRN: [id number]  PRIMARY CARE PROVIDER: Marguarite Arbour, MD  RESPONSIBLE PARTY:  Acct ID - Guarantor Home Phone Work Phone Relationship Acct Type  [id number] [facility]* [phone]  Self P/F     [address], [place], [place] [address]   Home visit completed with patient and spouse.  Appetite:  Remains fair.  Eating soft to pureed meals and consuming ensure/boost 3-4x daily. No issues with swallowing reported by private caregiver.  Mid-arm circumference at 21 cm.    Skin:  Right buttock with soft, raised area.  No opening and no drainage present.  Left toes with scabs in place to the 2nd and 3 rd digits.  Discussed pressure relieving alternatives.  Appears mattress is pressing against her feet.  No other skin breakdown observed.  Will update PCP of skin assessment.             CODE STATUS: DNR ADVANCED DIRECTIVES: Not on file MOST FORM: No PPS: 30%   PHYSICAL EXAM:   VITALS: Today's Vitals   12/19/22 0909  BP: (!) 150/88  Pulse: 92  Temp: (!) 97.5 F (36.4 C)  SpO2: 97%    LUNGS: clear to auscultation  CARDIAC: Cor RRR}  EXTREMITIES: - for edema SKIN: Skin color, texture, turgor normal. No rashes or lesions or normal see above NEURO: positive for gait problems and memory problems       Truitt Merle, RN

## 2022-12-28 DIAGNOSIS — I10 Essential (primary) hypertension: Secondary | ICD-10-CM | POA: Diagnosis not present

## 2022-12-28 DIAGNOSIS — Z636 Dependent relative needing care at home: Secondary | ICD-10-CM | POA: Diagnosis not present

## 2022-12-28 DIAGNOSIS — R54 Age-related physical debility: Secondary | ICD-10-CM | POA: Diagnosis not present

## 2023-01-01 DIAGNOSIS — G311 Senile degeneration of brain, not elsewhere classified: Secondary | ICD-10-CM | POA: Diagnosis not present

## 2023-01-01 DIAGNOSIS — I10 Essential (primary) hypertension: Secondary | ICD-10-CM | POA: Diagnosis not present

## 2023-01-01 DIAGNOSIS — R54 Age-related physical debility: Secondary | ICD-10-CM | POA: Diagnosis not present

## 2023-01-01 DIAGNOSIS — D509 Iron deficiency anemia, unspecified: Secondary | ICD-10-CM | POA: Diagnosis not present

## 2023-01-01 DIAGNOSIS — I480 Paroxysmal atrial fibrillation: Secondary | ICD-10-CM | POA: Diagnosis not present

## 2023-01-01 DIAGNOSIS — E7800 Pure hypercholesterolemia, unspecified: Secondary | ICD-10-CM | POA: Diagnosis not present

## 2023-01-07 DIAGNOSIS — F0280 Dementia in other diseases classified elsewhere without behavioral disturbance: Secondary | ICD-10-CM | POA: Diagnosis not present

## 2023-01-07 DIAGNOSIS — G309 Alzheimer's disease, unspecified: Secondary | ICD-10-CM | POA: Diagnosis not present

## 2023-01-07 DIAGNOSIS — F0150 Vascular dementia without behavioral disturbance: Secondary | ICD-10-CM | POA: Diagnosis not present

## 2023-01-15 ENCOUNTER — Other Ambulatory Visit: Payer: Medicare HMO

## 2023-01-18 DIAGNOSIS — Z636 Dependent relative needing care at home: Secondary | ICD-10-CM | POA: Diagnosis not present

## 2023-01-18 DIAGNOSIS — I10 Essential (primary) hypertension: Secondary | ICD-10-CM | POA: Diagnosis not present

## 2023-01-18 DIAGNOSIS — R54 Age-related physical debility: Secondary | ICD-10-CM | POA: Diagnosis not present

## 2023-02-06 DIAGNOSIS — F0280 Dementia in other diseases classified elsewhere without behavioral disturbance: Secondary | ICD-10-CM | POA: Diagnosis not present

## 2023-02-06 DIAGNOSIS — G309 Alzheimer's disease, unspecified: Secondary | ICD-10-CM | POA: Diagnosis not present

## 2023-02-06 DIAGNOSIS — F0150 Vascular dementia without behavioral disturbance: Secondary | ICD-10-CM | POA: Diagnosis not present

## 2023-02-07 ENCOUNTER — Telehealth: Payer: Self-pay

## 2023-02-07 NOTE — Telephone Encounter (Signed)
1605- Palliative Care Note  RN called and spoke with daughter. Verified using two identifiers. Daughter reports that pt has been doing well and has no problems. Daughter is agreeable to frequent calls. Ensured they had contact information.  Barbette Merino, RN

## 2023-03-02 ENCOUNTER — Other Ambulatory Visit: Payer: Medicare HMO

## 2023-03-02 DIAGNOSIS — Z515 Encounter for palliative care: Secondary | ICD-10-CM

## 2023-03-02 NOTE — Progress Notes (Signed)
PATIENT NAME: Laurie Jackson DOB: 1941-01-17 MRN: [id number]  PRIMARY CARE PROVIDER: Marguarite Arbour, MD  RESPONSIBLE PARTY:  Acct ID - Guarantor Home Phone Work Phone Relationship Acct Type  [id number] [facility]* [phone]  Self P/F     [address], [place], [place] [address]   I connected with  Laurie Jackson for Laurie Jackson on 03/02/23 by a video enabled telemedicine application and verified that I am speaking with the correct person using two identifiers.   I discussed the limitations of evaluation and management by telemedicine. The patient expressed understanding and agreed to proceed.   Connected with daughter Doyne Keel to complete a telephonic check in.  Patient remains the same other than a heat rash on her back.  We discussed calamine lotion for skin, loose fitting clothing and keeping back cooler to allow area to clear.  Daughter agreeable to a home visit for 7/9 @ 9 am.    Truitt Merle, RN

## 2023-03-20 ENCOUNTER — Other Ambulatory Visit: Payer: Medicare HMO

## 2023-06-19 ENCOUNTER — Ambulatory Visit: Payer: Medicare HMO

## 2023-06-19 DIAGNOSIS — Z23 Encounter for immunization: Secondary | ICD-10-CM | POA: Diagnosis not present

## 2023-06-19 DIAGNOSIS — Z719 Counseling, unspecified: Secondary | ICD-10-CM

## 2023-06-19 NOTE — Progress Notes (Signed)
Pt homebound and seen in home for requested flu and covid vaccines. Vaccine administration screening questions completed by and signed by husband; sent for scanning.  VISs given.  After vaccine care reviewed and questions answered.  Comirnaty 12+ 2024-25 formula and Fluzone high dose given by Marin Roberts RN; tolerated well.  [facility] updated and copy provided.  Cherlynn Polo, RN

## 2023-11-10 DEATH — deceased
